# Patient Record
Sex: Female | Born: 1970
Health system: Southern US, Community
[De-identification: ages and names within clinical notes are randomized; demographics above are authoritative.]

## PROBLEM LIST (undated history)

## (undated) DIAGNOSIS — G43909 Migraine, unspecified, not intractable, without status migrainosus: Secondary | ICD-10-CM

## (undated) DIAGNOSIS — Z87442 Personal history of urinary calculi: Secondary | ICD-10-CM

## (undated) DIAGNOSIS — T8859XA Other complications of anesthesia, initial encounter: Secondary | ICD-10-CM

## (undated) DIAGNOSIS — T4145XA Adverse effect of unspecified anesthetic, initial encounter: Secondary | ICD-10-CM

## (undated) DIAGNOSIS — N200 Calculus of kidney: Secondary | ICD-10-CM

## (undated) DIAGNOSIS — K219 Gastro-esophageal reflux disease without esophagitis: Secondary | ICD-10-CM

## (undated) HISTORY — DX: Calculus of kidney: N20.0

## (undated) HISTORY — PX: DILATION AND EVACUATION: SHX1459

## (undated) HISTORY — DX: Migraine, unspecified, not intractable, without status migrainosus: G43.909

## (undated) HISTORY — DX: Gastro-esophageal reflux disease without esophagitis: K21.9

## (undated) HISTORY — PX: DILATION AND CURETTAGE OF UTERUS: SHX78

---
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
## 1997-11-18 ENCOUNTER — Other Ambulatory Visit: Admission: RE | Admit: 1997-11-18 | Discharge: 1997-11-18 | Payer: Self-pay | Admitting: Obstetrics and Gynecology

## 1998-06-17 ENCOUNTER — Inpatient Hospital Stay (HOSPITAL_COMMUNITY): Admission: AD | Admit: 1998-06-17 | Discharge: 1998-06-19 | Payer: Self-pay | Admitting: Obstetrics and Gynecology

## 1998-07-20 ENCOUNTER — Other Ambulatory Visit: Admission: RE | Admit: 1998-07-20 | Discharge: 1998-07-20 | Payer: Self-pay | Admitting: Obstetrics and Gynecology

## 1999-09-02 ENCOUNTER — Other Ambulatory Visit: Admission: RE | Admit: 1999-09-02 | Discharge: 1999-09-02 | Payer: Self-pay | Admitting: Obstetrics and Gynecology

## 2000-02-01 ENCOUNTER — Other Ambulatory Visit: Admission: RE | Admit: 2000-02-01 | Discharge: 2000-02-01 | Payer: Self-pay | Admitting: Obstetrics and Gynecology

## 2000-03-06 ENCOUNTER — Ambulatory Visit (HOSPITAL_COMMUNITY): Admission: RE | Admit: 2000-03-06 | Discharge: 2000-03-06 | Payer: Self-pay | Admitting: Obstetrics and Gynecology

## 2000-03-06 ENCOUNTER — Encounter: Payer: Self-pay | Admitting: Obstetrics and Gynecology

## 2000-07-14 ENCOUNTER — Inpatient Hospital Stay (HOSPITAL_COMMUNITY): Admission: AD | Admit: 2000-07-14 | Discharge: 2000-07-14 | Payer: Self-pay | Admitting: Obstetrics and Gynecology

## 2000-08-22 ENCOUNTER — Inpatient Hospital Stay (HOSPITAL_COMMUNITY): Admission: AD | Admit: 2000-08-22 | Discharge: 2000-08-26 | Payer: Self-pay | Admitting: Obstetrics and Gynecology

## 2000-08-22 ENCOUNTER — Encounter (INDEPENDENT_AMBULATORY_CARE_PROVIDER_SITE_OTHER): Payer: Self-pay

## 2000-10-12 ENCOUNTER — Other Ambulatory Visit: Admission: RE | Admit: 2000-10-12 | Discharge: 2000-10-12 | Payer: Self-pay | Admitting: Obstetrics and Gynecology

## 2003-08-21 ENCOUNTER — Other Ambulatory Visit: Admission: RE | Admit: 2003-08-21 | Discharge: 2003-08-21 | Payer: Self-pay | Admitting: Obstetrics and Gynecology

## 2005-02-02 ENCOUNTER — Other Ambulatory Visit: Admission: RE | Admit: 2005-02-02 | Discharge: 2005-02-02 | Payer: Self-pay | Admitting: Obstetrics and Gynecology

## 2006-09-14 ENCOUNTER — Encounter: Admission: RE | Admit: 2006-09-14 | Discharge: 2006-09-14 | Payer: Self-pay | Admitting: Obstetrics and Gynecology

## 2007-08-29 ENCOUNTER — Encounter: Admission: RE | Admit: 2007-08-29 | Discharge: 2007-08-29 | Payer: Self-pay | Admitting: Obstetrics and Gynecology

## 2008-06-27 HISTORY — PX: CYSTOSCOPY: SUR368

## 2009-03-27 DIAGNOSIS — N200 Calculus of kidney: Secondary | ICD-10-CM

## 2009-03-27 HISTORY — DX: Calculus of kidney: N20.0

## 2010-11-12 NOTE — Discharge Summary (Signed)
[facility]  Patient:    Lisa West, Lisa West                         MRN: [id number] Adm. Date:  [id number] Disc. Date: [id number] Attending:  Madelyn Flavors Dictator:   Danie Chandler, R.N.                           Discharge Summary  ADMISSION DIAGNOSIS:          Intrauterine pregnancy at term in labor.  DISCHARGE DIAGNOSES:          1. Intrauterine pregnancy at term in labor.                               2. Anemia.                               3. Fetal intolerance to labor with shoulder                                  cord.  PROCEDURES:                   On August 23, 2000, primary low cervical transverse cesarean section.  REASON FOR ADMISSION:         The patient is a 40 year old, married, white female, gravida 6, para 2, with an estimated date of confinement of August 28, 2000, placing her at 39+ weeks gestation.  The patient called, complaining of uterine contractions and was sent to the maternity admissions unit and found to be 4 cm dilated.  The patient was admitted in labor.  The patient has been using heparin and baby aspirin during the pregnancy due to recurrent pregnancy loss.  She stopped heparin at 36 weeks and her last baby aspirin was taken on the morning of August 22, 2000.  The patient was admitted and progressed. The patient again pushing with each contraction and she was set up for delivery.  She was noted to have a cervical rim, but was able to push through it.  The patient continued to push uncontrollably during contractions, however, the fetal heart rate was noted to decrease to the 100s, then to the 90s, and then to the 80s with pushes.  After each push, there was excellent variability and recovery of the fetal heart rate.  During the push, the rim of the cervix would retract, but then readvance after the push was completed. The patient continued to push with cervix retracting over the vertex, but again it continued to  advance after completion of pushes.  Subsequently the fetal heart rate tracing was noted to have decreased test variables to the 70s and ______ with good recovery.  It was the decision of the physician that since there was no further descent of the fetal vertex despite the fact that the cervical lip could be retracted and the fact that the variables were broad in scope that the baby could not tolerate continue pushing effort.  The decision was made to proceed with a primary cesarean section due to fetal intolerance of labor.  HOSPITAL COURSE:              The patient was taken to the operating room  and underwent the above-named procedure without complication.  This was productive of a viable female infant with Apgars of 7 at one minute and 9 at five minutes and an arterial cord pH of 7.09.  Postoperatively on day #1, the patients hemoglobin was 8.9, hematocrit 26.3, and white blood cell count 14.4.  The patient was started on iron.  On postoperative day #2, she had a good return of bowel function and was tolerating a regular diet.  She was ambulating well without difficulty and had good pain control.  Her hemoglobin on this day was 8.3.  DISPOSITION:                  She was discharged home on postoperative day #3.  CONDITION ON DISCHARGE:       Good.  DIET:                         Regular as tolerated.  ACTIVITY:                     No heavy lifting, no driving, and no vaginal entry.  FOLLOW-UP:                    She is to follow up in the office in one to two weeks for an incision check.  SPECIAL INSTRUCTIONS:         She is to call for temperature greater than 100 degrees, persistent nausea, vomiting, heavy vaginal bleeding, and/or drainage from the incision site.  DISCHARGE MEDICATIONS:        1. Prenatal vitamins one p.o. q.d.                               2. Percocet 5 mg as directed by M.D.                               3. Chromagen Forte one p.o. q.d. DD:  09/08/00 TD:   09/08/00 Job: [id number]

## 2010-11-12 NOTE — Op Note (Signed)
[facility]  Patient:    Lisa West, Lisa West                         MRN: [id number] Proc. Date: 08/23/00 Adm. Date:  [id number] Attending:  Madelyn Flavors CC:         [facility]  Juan H. Lily Peer, M.D.   Operative Report  PREOPERATIVE DIAGNOSIS:       Fetal intolerance to labor.  POSTOPERATIVE DIAGNOSIS:      Fetal intolerance to labor, shoulder cord.  OPERATION:                    Primary cesarean section, low cervical                               transverse.  SURGEON:                      Beather Arbour. Thomasena Edis, M.D., Ph.D.  ASSISTANT:                    Gaetano Hawthorne. Lily Peer, M.D.  ESTIMATED BLOOD LOSS:         800 cc.  FLUIDS:                       2000 cc Crystalloid plus Ancef IV after cord                               clamp.  DRAINS:                       Foley.  ANESTHESIA:                   Spinal.  COMPLICATIONS:                None.  DESCRIPTION OF PROCEDURE:     I was paged for delivery.  The patient was pushing with each contraction and set up for delivery.  She was noted to have a cervical rim but was able to push through it.  The patient continued to push uncontrollably during contractions; however, the fetal heart rate was noted to decrease to the 100s, then to the 90s, then to the 80s with pushes.  After each push, there was excellent variability and recovery of the fetal heart rate.  During a push, the rim of the cervix would retract but then readvance after the push was completed.  We attempted to have the patient just blow through contractions, but she was unable to do so because of the intense pressure.  She continued to push with cervix retracting over the vertex but again, it continued to advance after completion of pushes.  Subsequently, fetal heart tracing was noted to have decreased test variables to the 70s and 60s with good recovery, but it was this physicians decision that since there was no further  descent of the fetal vertex, despite the fact that I could the cervical lip retracted and the fact that the variables were broad in scope, that this baby could not tolerate continued pushing effort.  The decision was made for a primary cesarean section due to fetal intolerance of labor.  The patient was brought quickly down to the operating room and identified on the operating table.  After induction of adequate spinal anesthesia, she was placed in the left uterine displacement position, and a Foley catheter was inserted.  A Pfannenstiel incision was made and carried down to the fascia. The fascia was extended in the midline and extended bilaterally using Mayo scissors.  It was then separated free from the underlying muscles.  The muscles were separated in the midline down to the symphysis.  The peritoneum was entered bluntly, taking care to avoid bladder or other abdominal contents. Then incision was expanded superiorly then inferiorly down to the bladder edge.  Bladder blade was placed, and the bladder flap was developed.  The uterus was scored in the lower uterine segment, and the incision was extended in a curvilinear fashion using bandage scissors.  As this physician had suspected, the vertex was noted to be in the OP position, and this had been discussed with the patient previously.  The head was deflected, and the vertex was delivered onto the operative field without difficulty.  The infant was duly suctioned, and this was done until there was no further meconium noted. The patients fluid had been noted to be meconium stained at the time of spontaneous rupture of membranes.  The remainder of the infants body was delivered, and there was noted to be a shoulder cord which was easily reduced. The cord was doubly clamped and cut, and the baby was handed to the awaiting neonatal resuscitation team.  The placenta was manually removed and sent to pathology for examination.  The bladder blade  was replaced, and the uterus was exteriorized onto the abdomen and wrapped in wet lap pack.  When clamps were placed on the uterus, there was noted to be about a 3 cm cervical extension on the right, and this was repaired using a simple running, interlocking suture of 0 Monocryl.  The remainder of the uterus was repaired using a simple running, interlocking suture of 0 Monocryl.  There were noted to be three bleeding edges along the uterine incision, and excellent hemostasis of these edges was achieved using figure-of-eight sutures and 0 Monocryl.  The uterus was placed back into the abdominal cavity after noting that uterus, tubes, and ovaries appeared grossly normal.  The incision was again visualized and irrigated copiously with warm Lactated Ringers.  Excellent hemostasis was noted all along the uterine incision.  Kelly clamps were placed in the peritoneum, and the peritoneum was closed using simple running suture of 2-0 Monocryl.  The subfascial areas were examined for evidence of hemostasis. Excellent hemostasis was achieved using cautery.  The fascia was closed using two sutures of 0 PDS with each suture ______ along the incision, running to the midline and tied.  The subcutaneous tissue was irrigated copiously with warm Lactated Ringers, and excellent hemostasis was achieved using cautery. The skin was closed with staples.  The patient tolerated the procedure well without apparent complications and was transferred to the recovery room in stable condition after all instrument, sponge, and needle counts were correct. The baby was found to be a viable female with Apgars of 7 and 9.  Cord pH 7.09. He went to the normal newborn nursery.  It should be noted that the 7.09 cord pH was probably spuriously low due to the fact that we duly suctioned the baby and kept the baby depressed during the time of duly suctioning.  The duly suctioning probably was at least 1 minute in duration to suction  all of the meconium-stained fluid. DD:  08/23/00 TD:  08/23/00  Job: [id number]

## 2011-08-30 ENCOUNTER — Encounter: Payer: Self-pay | Admitting: *Deleted

## 2011-08-31 ENCOUNTER — Encounter: Payer: Self-pay | Admitting: *Deleted

## 2011-08-31 ENCOUNTER — Encounter: Payer: Self-pay | Admitting: Vascular Surgery

## 2011-08-31 ENCOUNTER — Ambulatory Visit (INDEPENDENT_AMBULATORY_CARE_PROVIDER_SITE_OTHER): Payer: Self-pay | Admitting: *Deleted

## 2011-08-31 DIAGNOSIS — I781 Nevus, non-neoplastic: Secondary | ICD-10-CM | POA: Insufficient documentation

## 2011-08-31 NOTE — Progress Notes (Signed)
X=.3% Sotradecol administered with a 27g butterfly.  Patient received a total of 11cc.  Treated her areas of concern. Tol well. Antic good results. Follow prn.  Photos: yes  Compression stockings applied: yes

## 2012-11-14 ENCOUNTER — Other Ambulatory Visit: Payer: Self-pay | Admitting: Obstetrics and Gynecology

## 2012-11-14 DIAGNOSIS — R928 Other abnormal and inconclusive findings on diagnostic imaging of breast: Secondary | ICD-10-CM

## 2012-11-26 ENCOUNTER — Ambulatory Visit
Admission: RE | Admit: 2012-11-26 | Discharge: 2012-11-26 | Disposition: A | Discharge status: Home / Self Care | Payer: 59 | Source: Ambulatory Visit | Attending: Obstetrics and Gynecology | Admitting: Obstetrics and Gynecology

## 2012-11-26 DIAGNOSIS — R928 Other abnormal and inconclusive findings on diagnostic imaging of breast: Secondary | ICD-10-CM

## 2015-07-09 MED FILL — LO LOESTRIN FE 1-10 TABLET: 1 MG-10 MCG | 84 days supply | Qty: 84 | Fill #1

## 2015-10-08 MED FILL — LO LOESTRIN FE 1-10 TABLET: 1 MG-10 MCG | 84 days supply | Qty: 84 | Fill #2

## 2015-12-21 MED FILL — LO LOESTRIN FE 1-10 TABLET: 1 MG-10 MCG | 28 days supply | Qty: 28 | Fill #3

## 2016-01-26 DIAGNOSIS — Z0100 Encounter for examination of eyes and vision without abnormal findings: Secondary | ICD-10-CM | POA: Diagnosis not present

## 2016-01-29 MED FILL — LO LOESTRIN FE 1-10 TABLET: 1 MG-10 MCG | 28 days supply | Qty: 28 | Fill #4

## 2016-03-04 MED FILL — LO LOESTRIN FE 1-10 TABLET: 1 MG-10 MCG | 84 days supply | Qty: 84 | Fill #0

## 2016-04-26 DIAGNOSIS — Z1231 Encounter for screening mammogram for malignant neoplasm of breast: Secondary | ICD-10-CM | POA: Diagnosis not present

## 2016-04-26 DIAGNOSIS — Z01419 Encounter for gynecological examination (general) (routine) without abnormal findings: Secondary | ICD-10-CM | POA: Diagnosis not present

## 2016-04-26 DIAGNOSIS — Z6823 Body mass index (BMI) 23.0-23.9, adult: Secondary | ICD-10-CM | POA: Diagnosis not present

## 2016-05-13 MED FILL — LO LOESTRIN FE 1-10 TABLET: 1 MG-10 MCG | 84 days supply | Qty: 84 | Fill #0

## 2016-08-08 MED FILL — LO LOESTRIN FE 1-10 TABLET: 1 MG-10 MCG | 84 days supply | Qty: 84 | Fill #1

## 2016-11-02 MED FILL — LO LOESTRIN FE 1-10 TABLET: 1 MG-10 MCG | 84 days supply | Qty: 84 | Fill #2

## 2017-01-27 MED FILL — LO LOESTRIN FE 1-10 TABLET: 1 MG-10 MCG | 84 days supply | Qty: 84 | Fill #3

## 2017-01-30 DIAGNOSIS — Z0100 Encounter for examination of eyes and vision without abnormal findings: Secondary | ICD-10-CM | POA: Diagnosis not present

## 2017-04-24 MED FILL — LO LOESTRIN FE 1-10 TABLET: 1 MG-10 MCG | 84 days supply | Qty: 84 | Fill #0

## 2017-07-06 DIAGNOSIS — Z1231 Encounter for screening mammogram for malignant neoplasm of breast: Secondary | ICD-10-CM | POA: Diagnosis not present

## 2017-07-06 DIAGNOSIS — Z01419 Encounter for gynecological examination (general) (routine) without abnormal findings: Secondary | ICD-10-CM | POA: Diagnosis not present

## 2017-07-06 DIAGNOSIS — Z6823 Body mass index (BMI) 23.0-23.9, adult: Secondary | ICD-10-CM | POA: Diagnosis not present

## 2017-07-07 DIAGNOSIS — Z13220 Encounter for screening for lipoid disorders: Secondary | ICD-10-CM | POA: Diagnosis not present

## 2017-07-10 MED FILL — LO LOESTRIN FE 1-10 TABLET: 1 MG-10 MCG | 84 days supply | Qty: 84 | Fill #0

## 2017-10-27 MED FILL — LO LOESTRIN FE 1-10 TABLET: 1 MG-10 MCG | 84 days supply | Qty: 84 | Fill #1

## 2018-01-24 MED FILL — LO LOESTRIN FE 1-10 TABLET: 1 MG-10 MCG | 84 days supply | Qty: 84 | Fill #2

## 2018-02-15 DIAGNOSIS — Z0100 Encounter for examination of eyes and vision without abnormal findings: Secondary | ICD-10-CM | POA: Diagnosis not present

## 2018-04-12 MED FILL — LO LOESTRIN FE 1-10 TABLET: 1 MG-10 MCG | 84 days supply | Qty: 84 | Fill #3

## 2018-07-17 DIAGNOSIS — R3121 Asymptomatic microscopic hematuria: Secondary | ICD-10-CM | POA: Diagnosis not present

## 2018-07-17 DIAGNOSIS — Z6824 Body mass index (BMI) 24.0-24.9, adult: Secondary | ICD-10-CM | POA: Diagnosis not present

## 2018-07-17 DIAGNOSIS — Z01419 Encounter for gynecological examination (general) (routine) without abnormal findings: Secondary | ICD-10-CM | POA: Diagnosis not present

## 2018-07-17 DIAGNOSIS — N390 Urinary tract infection, site not specified: Secondary | ICD-10-CM | POA: Diagnosis not present

## 2018-07-17 DIAGNOSIS — Z1231 Encounter for screening mammogram for malignant neoplasm of breast: Secondary | ICD-10-CM | POA: Diagnosis not present

## 2018-07-17 MED FILL — LO LOESTRIN FE 1-10 TABLET: 1 MG-10 MCG | 84 days supply | Qty: 84 | Fill #0

## 2018-07-27 ENCOUNTER — Encounter: Payer: Self-pay | Admitting: Gastroenterology

## 2018-08-03 ENCOUNTER — Encounter: Payer: Self-pay | Admitting: Gastroenterology

## 2018-08-03 ENCOUNTER — Ambulatory Visit (INDEPENDENT_AMBULATORY_CARE_PROVIDER_SITE_OTHER): Payer: Commercial Managed Care - PPO | Admitting: Gastroenterology

## 2018-08-03 VITALS — BP 120/84 | HR 73 | Ht 62.0 in | Wt 130.1 lb

## 2018-08-03 DIAGNOSIS — R053 Chronic cough: Secondary | ICD-10-CM

## 2018-08-03 DIAGNOSIS — R05 Cough: Secondary | ICD-10-CM | POA: Diagnosis not present

## 2018-08-03 DIAGNOSIS — K219 Gastro-esophageal reflux disease without esophagitis: Secondary | ICD-10-CM

## 2018-08-03 NOTE — Progress Notes (Signed)
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 Started OTC Prilosec 20 mg for 5 weeks with some improvement, but still with daily sxs. Not related to food types; has kept food diary.  Symptoms tend to be postprandial and worse in the evening.  No nocturnal symptoms. No associated dysphagia, odynophagia and no n/v/d/c/f/c, hematochezia, melena.  She did have a URI approximately 2 months prior to symptom onset, but otherwise was in her usual state of health.  No prior history of reflux.  No previous EGD.  No known family history of CRC, GI malignancy, liver disease, pancreatic disease, or IBD.    Past Medical History:  Diagnosis Date  . GERD (gastroesophageal reflux disease)   . Kidney stone 03/2009     Past Surgical History:  Procedure Laterality Date  . CESAREAN SECTION  2002  . CYSTOSCOPY  2010   Family History  Problem Relation Age of Onset  . Kidney disease Maternal Grandmother        kidney stones that cause her to lose one of her kindeys   . Breast cancer Paternal Grandmother 95  . Colon cancer Neg Hx   . Esophageal cancer Neg Hx    Social History   Tobacco Use  . Smoking status: Never Smoker  . Smokeless tobacco: Never Used  Substance Use Topics  . Alcohol use: Yes  . Drug use: Never   Current Outpatient Medications  Medication Sig Dispense Refill  . LO LOESTRIN FE 1 MG-10 MCG / 10 MCG tablet 1 tablet daily.    Marland Kitchen omeprazole (PRILOSEC) 20 MG capsule Take 20 mg by mouth daily.     No current facility-administered medications for this visit.    No Known Allergies   Review of Systems: All systems reviewed and negative except where noted in HPI.     Physical Exam:    Wt Readings from Last 3 Encounters:  08/03/18  130 lb 2 oz (59 kg)    BP 120/84   Pulse 73   Ht 5\' 2"  (1.575 m)   Wt 130 lb 2 oz (59 kg)   BMI 23.80 kg/m  Constitutional:  Pleasant, in no acute distress. Psychiatric: Normal mood and affect. Behavior is normal. EENT: Pupils normal.  Conjunctivae are normal. No scleral icterus. Neck supple. No cervical LAD. Cardiovascular: Normal rate, regular rhythm. No edema Pulmonary/chest: Effort normal and breath sounds normal. No wheezing, rales or rhonchi. Abdominal: Soft, nondistended, nontender. Bowel sounds active throughout. There are no masses palpable. No hepatomegaly. Neurological: Alert and oriented to person place and time. Skin: Skin is warm and dry. No rashes noted.   ASSESSMENT AND PLAN;   Lisa West is a 48 y.o. female presenting with:  1) Heartburn, regurgitation: Clinically she seems most consistent with new onset reflux.  Unclear as to why she did have such an abrupt onset, other than possibly secondary to anatomic changes with recent URI and prolonged (2 months) coughing.  Symptoms started shortly after resolution of that cough.  Has had some relief with OTC Prilosec 20 mg, but still with daily symptoms.  - Discussed trial of high-dose PPI for diagnostic/therapeutic intent versus evaluation with EGD to evaluate for  erosive esophagitis, LES laxity, hiatal hernia with possible intent for preoperative evaluation for a possible endoscopic antireflux procedure in the future.  She opted for the latter which I feel to be reasonable. - Resume antireflux lifestyle measures for now -Okay to resume Prilosec 20 mg for now with plan for expedited EGD to evaluate for anatomic defect, erosive esophagitis, etc. as above -Additional recommendations for ongoing treatment pending endoscopic findings  2) Chronic cough: Suspect atypical reflux manifestation.  Will evaluate for objective evidence of reflux as above.  Pending endoscopic findings, may still need to consider high-dose PPI trial  for diagnostic/therapeutic intent.  She appropriately wants to hold off on high-dose PPI in favor of expedited endoscopic evaluation prior to high-dose treatment, which is reasonable in this setting.  The indications, risks, and benefits of EGD were explained to the patient in detail. Risks include but are not limited to bleeding, perforation, adverse reaction to medications, and cardiopulmonary compromise. Sequelae include but are not limited to the possibility of surgery, hositalization, and mortality. The patient verbalized understanding and wished to proceed. All questions answered, referred to scheduler. Further recommendations pending results of the exam.   Lavena Bullion, DO, FACG  08/03/2018, 9:25 AM   Molli Posey, MD

## 2018-08-03 NOTE — Patient Instructions (Signed)
If you are age 50 or older, your body mass index should be between 23-30. Your Body mass index is 23.8 kg/m. If this is out of the aforementioned range listed, please consider follow up with your Primary Care Provider.  If you are age 15 or younger, your body mass index should be between 19-25. Your Body mass index is 23.8 kg/m. If this is out of the aformentioned range listed, please consider follow up with your Primary Care Provider.   You have been scheduled for an endoscopy. Please follow written instructions given to you at your visit today. If you use inhalers (even only as needed), please bring them with you on the day of your procedure. Your physician has requested that you go to www.startemmi.com and enter the access code given to you at your visit today. This web site gives a general overview about your procedure. However, you should still follow specific instructions given to you by our office regarding your preparation for the procedure.  It was a pleasure to see you today!  Vito Cirigliano, D.O.

## 2018-08-06 ENCOUNTER — Ambulatory Visit (AMBULATORY_SURGERY_CENTER): Payer: Commercial Managed Care - PPO | Admitting: Gastroenterology

## 2018-08-06 ENCOUNTER — Encounter: Payer: Self-pay | Admitting: Gastroenterology

## 2018-08-06 VITALS — BP 133/76 | HR 71 | Temp 99.6°F | Resp 46 | Ht 62.0 in | Wt 130.0 lb

## 2018-08-06 DIAGNOSIS — R12 Heartburn: Secondary | ICD-10-CM | POA: Diagnosis not present

## 2018-08-06 DIAGNOSIS — R05 Cough: Secondary | ICD-10-CM | POA: Diagnosis not present

## 2018-08-06 DIAGNOSIS — R053 Chronic cough: Secondary | ICD-10-CM

## 2018-08-06 DIAGNOSIS — K219 Gastro-esophageal reflux disease without esophagitis: Secondary | ICD-10-CM

## 2018-08-06 MED ORDER — SODIUM CHLORIDE 0.9 % IV SOLN: 500.0000 mL | INTRAVENOUS | Status: DC

## 2018-08-06 MED ORDER — OMEPRAZOLE 40 MG PO CPDR: 40.0000 mg | DELAYED_RELEASE_CAPSULE | Freq: Two times a day (BID) | ORAL | 0 refills | Status: DC

## 2018-08-06 MED FILL — OMEPRAZOLE 40 MG CPDR: 40 | 32 days supply | Qty: 64 | Fill #0

## 2018-08-06 NOTE — Op Note (Signed)
[facility] Patient Name: Lisa West Procedure Date: 08/06/2018 11:30 AM MRN: [id number] Endoscopist: Gerrit Heck , MD Age: 48 Referring MD:  Date of Birth: August 09, 1970 Gender: Female Account #: [id number] Procedure:                Upper GI endoscopy Indications:              Heartburn, Suspected esophageal reflux, Chronic                            cough, Regurgitation                           48 yo female with new onset reflux symptoms                            (heartburn, regurgitation) and non-productive                            cough. No improvement with trial of omeprazole 20                            mg daily. No dysphagia or odynophagia. Presents                            today for endoscopic evaluation for erosive                            esophagitis, LES laxity, hiatal henia. Medicines:                Monitored Anesthesia Care Procedure:                Pre-Anesthesia Assessment:                           - Prior to the procedure, a History and Physical                            was performed, and patient medications and                            allergies were reviewed. The patient's tolerance of                            previous anesthesia was also reviewed. The risks                            and benefits of the procedure and the sedation                            options and risks were discussed with the patient.                            All questions were answered, and informed consent  was obtained. Prior Anticoagulants: The patient has                            taken no previous anticoagulant or antiplatelet                            agents. ASA Grade Assessment: II - A patient with                            mild systemic disease. After reviewing the risks                            and benefits, the patient was deemed in                            satisfactory condition to undergo the procedure.              After obtaining informed consent, the endoscope was                            passed under direct vision. Throughout the                            procedure, the patient's blood pressure, pulse, and                            oxygen saturations were monitored continuously. The                            Endoscope was introduced through the mouth, and                            advanced to the second part of duodenum. The upper                            GI endoscopy was accomplished without difficulty.                            The patient tolerated the procedure well. Scope In: Scope Out: Findings:                 Esophagogastric landmarks were identified: the                            Z-line was found at 36 cm, the gastroesophageal                            junction was found at 36 cm and the site of hiatal                            narrowing was found at 36 cm from the incisors.                           The Z-line was regular and was  found 36 cm from the                            incisors.                           The gastroesophageal flap valve was visualized                            endoscopically and classified as Hill Grade I                            (prominent fold, tight to endoscope).                           The examined esophagus was normal.                           The entire examined stomach was normal.                           The duodenal bulb, first portion of the duodenum                            and second portion of the duodenum were normal. Complications:            No immediate complications. Estimated Blood Loss:     Estimated blood loss: none. Impression:               - Esophagogastric landmarks identified as above.                           - Z-line regular, 36 cm from the incisors.                           - Gastroesophageal flap valve classified as Hill                            Grade I (prominent fold, tight to endoscope).                            - Normal esophagus.                           - Normal stomach.                           - Normal duodenal bulb, first portion of the                            duodenum and second portion of the duodenum.                           - No specimens collected. Recommendation:           - Patient has a contact number available for  emergencies. The signs and symptoms of potential                            delayed complications were discussed with the                            patient. Return to normal activities tomorrow.                            Written discharge instructions were provided to the                            patient.                           - Resume previous diet today.                           - Use Prilosec (omeprazole) 40 mg PO BID for 4                            weeks for diagnostic and therapeutic intent for                            suspected new onset reflux, with plan to titrate to                            lowest effective dose for control of reflux                            symptoms.                           - Depending on response to therapy, may need to                            consider further evaluation with pH/Impedance                            testing if ongoing symptoms despite trial of high                            dose acid suppression therapy.                           - Return to GI clinic in 2-3 months. Gerrit Heck, MD 08/06/2018 11:50:01 AM

## 2018-08-06 NOTE — Progress Notes (Signed)
PT taken to PACU. Monitors in place. VSS. Report given to RN. 

## 2018-08-06 NOTE — Patient Instructions (Signed)
Recommendations for 1st meal: Eggs Grits Toast Pancakes/waffles Lean meat  Take Prilosec 40mg  twice daily for 8 weeks then re-evaluate.  May need pH study. Return to GI clinic in 2-3 months.      YOU HAD AN ENDOSCOPIC PROCEDURE TODAY AT [facility]:   Refer to the procedure report that was given to you for any specific questions about what was found during the examination.  If the procedure report does not answer your questions, please call your gastroenterologist to clarify.  If you requested that your care partner not be given the details of your procedure findings, then the procedure report has been included in a sealed envelope for you to review at your convenience later.  YOU SHOULD EXPECT: Some feelings of bloating in the abdomen. Passage of more gas than usual.  Walking can help get rid of the air that was put into your GI tract during the procedure and reduce the bloating. If you had a lower endoscopy (such as a colonoscopy or flexible sigmoidoscopy) you may notice spotting of blood in your stool or on the toilet paper. If you underwent a bowel prep for your procedure, you may not have a normal bowel movement for a few days.  Please Note:  You might notice some irritation and congestion in your nose or some drainage.  This is from the oxygen used during your procedure.  There is no need for concern and it should clear up in a day or so.  SYMPTOMS TO REPORT IMMEDIATELY:    Following upper endoscopy (EGD)  Vomiting of blood or coffee ground material  New chest pain or pain under the shoulder blades  Painful or persistently difficult swallowing  New shortness of breath  Fever of 100F or higher  Black, tarry-looking stools  For urgent or emergent issues, a gastroenterologist can be reached at any hour by calling [phone].   DIET:  We do recommend a small meal at first, but then you may proceed to your regular diet.  Drink plenty of fluids but you should  avoid alcoholic beverages for 24 hours.  ACTIVITY:  You should plan to take it easy for the rest of today and you should NOT DRIVE or use heavy machinery until tomorrow (because of the sedation medicines used during the test).    FOLLOW UP: Our staff will call the number listed on your records the next business day following your procedure to check on you and address any questions or concerns that you may have regarding the information given to you following your procedure. If we do not reach you, we will leave a message.  However, if you are feeling well and you are not experiencing any problems, there is no need to return our call.  We will assume that you have returned to your regular daily activities without incident.  If any biopsies were taken you will be contacted by phone or by letter within the next 1-3 weeks.  Please call us at [phone] if you have not heard about the biopsies in 3 weeks.    SIGNATURES/CONFIDENTIALITY: You and/or your care partner have signed paperwork which will be entered into your electronic medical record.  These signatures attest to the fact that that the information above on your After Visit Summary has been reviewed and is understood.  Full responsibility of the confidentiality of this discharge information lies with you and/or your care-partner.

## 2018-08-07 ENCOUNTER — Telehealth: Payer: Self-pay

## 2018-08-07 NOTE — Telephone Encounter (Signed)
  Follow up Call-  Call back number 08/06/2018  Post procedure Call Back phone  # [phone]  Permission to leave phone message Yes  Some recent data might be hidden     Patient questions:  Do you have a fever, pain , or abdominal swelling? No. Pain Score  0 *  Have you tolerated food without any problems? Yes.    Have you been able to return to your normal activities? Yes.    Do you have any questions about your discharge instructions: Diet   No. Medications  No. Follow up visit  No.  Do you have questions or concerns about your Care? No.  Actions: * If pain score is 4 or above: No action needed, pain <4.

## 2018-08-07 NOTE — Telephone Encounter (Signed)
Follow call made, name identifier, left a voicemail. 

## 2018-10-25 MED FILL — LO LOESTRIN FE 1-10 TABLET: 1 MG-10 MCG | 84 days supply | Qty: 84 | Fill #0

## 2019-01-17 MED FILL — LO LOESTRIN FE 1-10 TABLET: 1 MG-10 MCG | 84 days supply | Qty: 84 | Fill #0

## 2019-01-24 ENCOUNTER — Ambulatory Visit (INDEPENDENT_AMBULATORY_CARE_PROVIDER_SITE_OTHER): Payer: 59

## 2019-01-24 ENCOUNTER — Ambulatory Visit (INDEPENDENT_AMBULATORY_CARE_PROVIDER_SITE_OTHER): Payer: 59 | Admitting: Surgery

## 2019-01-24 ENCOUNTER — Other Ambulatory Visit: Payer: Self-pay

## 2019-01-24 ENCOUNTER — Encounter: Payer: Self-pay | Admitting: Surgery

## 2019-01-24 VITALS — Ht 62.0 in | Wt 132.0 lb

## 2019-01-24 DIAGNOSIS — M25512 Pain in left shoulder: Secondary | ICD-10-CM

## 2019-01-24 DIAGNOSIS — M7542 Impingement syndrome of left shoulder: Secondary | ICD-10-CM | POA: Diagnosis not present

## 2019-01-24 MED ORDER — BUPIVACAINE HCL 0.25 % IJ SOLN
4.0000 mL | INTRAMUSCULAR | Status: AC | PRN
  Administered 2019-01-24: 09:00:00 4 mL via INTRA_ARTICULAR

## 2019-01-24 MED ORDER — METHYLPREDNISOLONE ACETATE 40 MG/ML IJ SUSP
40.0000 mg | INTRAMUSCULAR | Status: AC | PRN
  Administered 2019-01-24: 09:00:00 40 mg via INTRA_ARTICULAR

## 2019-01-24 MED ORDER — LIDOCAINE HCL 1 % IJ SOLN
3.0000 mL | INTRAMUSCULAR | Status: AC | PRN
  Administered 2019-01-24: 09:00:00 3 mL

## 2019-01-24 NOTE — Progress Notes (Signed)
Office Visit Note   Patient: Lisa West           Date of Birth: 1971-05-17           MRN: [id number] Visit Date: 01/24/2019              Requested by: Molli Posey, [address] [place],  [place] [address] PCP: Molli Posey, MD   Assessment & Plan: Visit Diagnoses:  1. Left shoulder pain, unspecified chronicity   2. Impingement syndrome of left shoulder     Plan: With patient's ongoing left shoulder pain up to this point I recommend trying conservative treatment with subacromial Marcaine/Depo-Medrol injection.  After patient consent left shoulder was prepped with Betadine and subacromial injection performed.  After sitting for about 5 minutes patient reported complete relief of her pain with Marcaine in place.  Follow-up with me in 4 weeks for recheck.  With patient working in the [employer] I will touch base with her weekly to see how she is feeling.  I did advise her to use over-the-counter NSAID as needed.  If she still continues to be symptomatic at her next appointment we may consider getting an MRI scan.  I did advise her to take it easy over the next couple of days and then resume activities as tolerated if she is pain-free.  All questions answered.  Follow-Up Instructions: Return in about 4 weeks (around 02/21/2019) for With Jeneen Rinks.   Orders:  Orders Placed This Encounter  Procedures  . XR Shoulder Left   No orders of the defined types were placed in this encounter.     Procedures: Large Joint Inj: L subacromial bursa on 01/24/2019 9:16 AM Indications: pain Details: 25 G 1.5 in needle, posterior approach  Arthrogram: No  Medications: 3 mL lidocaine 1 %; 4 mL bupivacaine 0.25 %; 40 mg methylPREDNISolone acetate 40 MG/ML Outcome: tolerated well, no immediate complications Consent was given by the patient. Patient was prepped and draped in the usual sterile fashion.       Clinical Data: No additional findings.   Subjective: Chief Complaint   Patient presents with  . 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  Pain ongoing x5 weeks.  No specific injury that she can recall.  Thinks that this may have been aggravated by laying on her shoulder wrong at night.  No previous problems for onset.  Currently pain bothered with overhead activity and reaching on her back.  No cervical spine or radicular component.  No feeling of shoulder instability.  Pain does awaken her at night still.  Also bothers her while she is working in the [employer]. Review of Systems No current cardiac pulmonary GI GU issues  Objective: Vital Signs: Ht 5\' 2"  (1.575 m)   Wt 132 lb (59.9 kg)   BMI 24.14 kg/m   Physical Exam Constitutional:      Appearance: Normal appearance.  HENT:     Head: Normocephalic.  Eyes:     Extraocular Movements: Extraocular movements intact.     Pupils: Pupils are equal, round, and reactive to light.  Pulmonary:     Effort: Pulmonary effort is normal. No respiratory distress.     Breath sounds: Normal breath sounds.  Musculoskeletal:     Comments: Gait is normal.  Good cervical spine range of motion.  No brachial plexus tenderness.  Left  shoulder she has good range of motion but with discomfort overhead.  Positive impingement test.  Negative drop arm.  Negative apprehension.  Good cuff strength.  Pain with supraspinatus resistance testing.  Neurovascular intact.  Right shoulder unremarkable.  Skin:    General: Skin is warm and dry.  Neurological:     General: No focal deficit present.     Mental Status: She is alert and oriented to person, place, and time.  Psychiatric:        Mood and Affect: Mood normal.        Behavior: Behavior normal.     Ortho Exam  Specialty Comments:  No specialty comments available.  Imaging: Xr Shoulder Left  Result Date: 01/24/2019 X-ray left shoulder shows mild to moderate acromioclavicular  degenerative changes.  Glenohumeral joint looks good.  No acute finding.  Type I acromion.    PMFS History: Patient Active Problem List   Diagnosis Date Noted  . Nevus, non-neoplastic 08/31/2011   Past Medical History:  Diagnosis Date  . GERD (gastroesophageal reflux disease)   . Kidney stone 03/2009    Family History  Problem Relation Age of Onset  . Kidney disease Maternal Grandmother        kidney stones that cause her to lose one of her kindeys   . Breast cancer Paternal Grandmother 95  . Colon cancer Neg Hx   . Esophageal cancer Neg Hx   . Rectal cancer Neg Hx   . Stomach cancer Neg Hx     Past Surgical History:  Procedure Laterality Date  . CESAREAN SECTION  2002  . CYSTOSCOPY  2010  . DILATION AND CURETTAGE OF UTERUS    . DILATION AND EVACUATION     X 2   Social History   Occupational History  . Occupation: Immunologist  Tobacco Use  . Smoking status: Never Smoker  . Smokeless tobacco: Never Used  Substance and Sexual Activity  . Alcohol use: Yes    Alcohol/week: 4.0 standard drinks    Types: 4 Glasses of wine per week  . Drug use: Never  . Sexual activity: Not on file

## 2019-01-25 DIAGNOSIS — S40211A Abrasion of right shoulder, initial encounter: Secondary | ICD-10-CM | POA: Diagnosis not present

## 2019-01-25 DIAGNOSIS — S61412A Laceration without foreign body of left hand, initial encounter: Secondary | ICD-10-CM | POA: Diagnosis not present

## 2019-01-25 DIAGNOSIS — W19XXXA Unspecified fall, initial encounter: Secondary | ICD-10-CM | POA: Diagnosis not present

## 2019-02-21 ENCOUNTER — Ambulatory Visit: Payer: 59 | Admitting: Surgery

## 2019-03-14 DIAGNOSIS — Z0100 Encounter for examination of eyes and vision without abnormal findings: Secondary | ICD-10-CM | POA: Diagnosis not present

## 2019-04-29 DIAGNOSIS — D649 Anemia, unspecified: Secondary | ICD-10-CM | POA: Diagnosis not present

## 2019-04-29 DIAGNOSIS — N920 Excessive and frequent menstruation with regular cycle: Secondary | ICD-10-CM | POA: Diagnosis not present

## 2019-04-29 DIAGNOSIS — D259 Leiomyoma of uterus, unspecified: Secondary | ICD-10-CM | POA: Diagnosis not present

## 2019-05-02 DIAGNOSIS — N939 Abnormal uterine and vaginal bleeding, unspecified: Secondary | ICD-10-CM | POA: Diagnosis not present

## 2019-05-02 DIAGNOSIS — N920 Excessive and frequent menstruation with regular cycle: Secondary | ICD-10-CM | POA: Diagnosis not present

## 2019-05-02 DIAGNOSIS — D259 Leiomyoma of uterus, unspecified: Secondary | ICD-10-CM | POA: Diagnosis not present

## 2019-05-02 NOTE — Patient Instructions (Addendum)
YOU NEED TO HAVE A COVID 19 TEST ON_11/6______ @_12 :50 pm______, THIS TEST MUST BE DONE BEFORE SURGERY, COME  [address], [place] , [address].  ([place]) ONCE YOUR COVID TEST IS COMPLETED, PLEASE BEGIN THE QUARANTINE INSTRUCTIONS AS OUTLINED IN YOUR HANDOUT.       Lisa West       Your procedure is scheduled on 05/07/19   Report to [facility]  at   5:30 A.M.   Call this number if you have problems the morning of surgery:[phone]    OUR ADDRESS IS [address], WE ARE LOCATED IN THE [facility]ICAL [facility] WITH [facility].  Do not eat food After Midnight.   YOU MAY HAVE CLEAR LIQUIDS FROM MIDNIGHT UNTIL 4:30AM.   At 4:30AM Please finish the prescribed Pre-Surgery  drink.   Nothing by mouth after you finish the  drink !    Take these medicines the morning of surgery with A SIP OF WATER  prilosec   Do not wear jewelry, make-up or nail polish.  Do not wear lotions, powders, or perfumes, or deoderant.  Do not shave 48 hours prior to surgery.    Do not bring valuables to the hospital.  [facility] is not responsible for any belongings or valuables.  Contacts, dentures or bridgework may not be worn into surgery.     For patients admitted to the hospital, discharge time will be determined by your treatment team.  Patients discharged the day of surgery will not be allowed to drive home.   Special instructions:           [facility] - Preparing for Surgery  Before surgery, you can play an important role.   Because skin is not sterile, your skin needs to be as free of germs as possible.   You can reduce the number of germs on your skin by washing with CHG (chlorahexidine gluconate) soap before surgery.   CHG is an antiseptic cleaner which kills germs and bonds with the skin to continue killing germs even after washing. Please DO NOT use if you have an allergy to CHG or antibacterial soaps.   If your skin becomes  reddened/irritated stop using the CHG and inform your nurse when you arrive at Short Stay. Do not shave (including legs and underarms) for at least 48 hours prior to the first CHG shower.    Please follow these instructions carefully:  1.  Shower with CHG Soap the night before surgery and the  morning of Surgery.  2.  If you choose to wash your hair, wash your hair first as usual with your  normal  shampoo.  3.  After you shampoo, rinse your hair and body thoroughly to remove the  shampoo.                                        4.  Use CHG as you would any other liquid soap.  You can apply chg directly  to the skin and wash                       Gently with a scrungie or clean washcloth.  5.  Apply the CHG Soap to your body ONLY FROM THE NECK DOWN.   Do not use on face/ open  Wound or open sores. Avoid contact with eyes, ears mouth and genitals (private parts).                       Wash face,  Genitals (private parts) with your normal soap.             6.  Wash thoroughly, paying special attention to the area where your surgery  will be performed.  7.  Thoroughly rinse your body with warm water from the neck down.  8.  DO NOT shower/wash with your normal soap after using and rinsing off  the CHG Soap.             9.  Pat yourself dry with a clean towel.            10.  Wear clean pajamas.            11.  Place clean sheets on your bed the night of your first shower and do not  sleep with pets. Day of Surgery : Do not apply any lotions/deodorants the morning of surgery.  Please wear clean clothes to the hospital/surgery center.  FAILURE TO FOLLOW THESE INSTRUCTIONS MAY RESULT IN THE CANCELLATION OF YOUR SURGERY PATIENT SIGNATURE_________________________________  NURSE SIGNATURE__________________________________  ________________________________________________________________________

## 2019-05-03 ENCOUNTER — Encounter (HOSPITAL_COMMUNITY)
Admission: RE | Admit: 2019-05-03 | Discharge: 2019-05-03 | Disposition: A | Discharge status: Home / Self Care | Payer: 59 | Source: Ambulatory Visit | Attending: Obstetrics and Gynecology | Admitting: Obstetrics and Gynecology

## 2019-05-03 ENCOUNTER — Other Ambulatory Visit (HOSPITAL_COMMUNITY)
Admission: RE | Admit: 2019-05-03 | Discharge: 2019-05-03 | Disposition: A | Discharge status: Home / Self Care | Payer: 59 | Source: Ambulatory Visit | Attending: Obstetrics and Gynecology | Admitting: Obstetrics and Gynecology

## 2019-05-03 ENCOUNTER — Encounter (HOSPITAL_COMMUNITY): Payer: Self-pay

## 2019-05-03 ENCOUNTER — Other Ambulatory Visit: Payer: Self-pay

## 2019-05-03 DIAGNOSIS — Z20828 Contact with and (suspected) exposure to other viral communicable diseases: Secondary | ICD-10-CM | POA: Diagnosis not present

## 2019-05-03 DIAGNOSIS — Z01812 Encounter for preprocedural laboratory examination: Secondary | ICD-10-CM | POA: Insufficient documentation

## 2019-05-03 DIAGNOSIS — D259 Leiomyoma of uterus, unspecified: Secondary | ICD-10-CM | POA: Diagnosis not present

## 2019-05-03 HISTORY — DX: Other complications of anesthesia, initial encounter: T88.59XA

## 2019-05-03 HISTORY — DX: Personal history of urinary calculi: Z87.442

## 2019-05-03 LAB — CBC
HCT: 38.6 % (ref 36.0–46.0)
Hemoglobin: 12.5 g/dL (ref 12.0–15.0)
MCH: 31 pg (ref 26.0–34.0)
MCHC: 32.4 g/dL (ref 30.0–36.0)
MCV: 95.8 fL (ref 80.0–100.0)
Platelets: 271 10*3/uL (ref 150–400)
RBC: 4.03 MIL/uL (ref 3.87–5.11)
RDW: 12.5 % (ref 11.5–15.5)
WBC: 9.8 10*3/uL (ref 4.0–10.5)
nRBC: 0 % (ref 0.0–0.2)

## 2019-05-03 LAB — ABO/RH: ABO/RH(D): O POS

## 2019-05-03 NOTE — Progress Notes (Signed)
PCP - Dr. Matthew Saras Cardiologist - none  Chest x-ray - no EKG - no Stress Test - no ECHO - no Cardiac Cath - no  Sleep Study - no CPAP - no  Fasting Blood Sugar - NA Checks Blood Sugar _____ times a day  Blood Thinner Instructions:  NA Aspirin Instructions: Last Dose:  Anesthesia review:   Patient denies shortness of breath, fever, cough and chest pain at PAT appointment yes  Patient verbalized understanding of instructions that were given to them at the PAT appointment. Patient was also instructed that they will need to review over the PAT instructions again at home before surgery. yes

## 2019-05-04 LAB — NOVEL CORONAVIRUS, NAA (HOSP ORDER, SEND-OUT TO REF LAB; TAT 18-24 HRS): SARS-CoV-2, NAA: NOT DETECTED

## 2019-05-05 ENCOUNTER — Emergency Department (HOSPITAL_COMMUNITY)
Admission: EM | Admit: 2019-05-05 | Discharge: 2019-05-05 | Disposition: A | Discharge status: Home / Self Care | Payer: 59 | Attending: Emergency Medicine | Admitting: Emergency Medicine

## 2019-05-05 ENCOUNTER — Encounter (HOSPITAL_COMMUNITY): Payer: Self-pay

## 2019-05-05 ENCOUNTER — Other Ambulatory Visit: Payer: Self-pay

## 2019-05-05 DIAGNOSIS — Z79899 Other long term (current) drug therapy: Secondary | ICD-10-CM | POA: Insufficient documentation

## 2019-05-05 DIAGNOSIS — N938 Other specified abnormal uterine and vaginal bleeding: Secondary | ICD-10-CM | POA: Insufficient documentation

## 2019-05-05 DIAGNOSIS — D649 Anemia, unspecified: Secondary | ICD-10-CM | POA: Diagnosis not present

## 2019-05-05 DIAGNOSIS — Z793 Long term (current) use of hormonal contraceptives: Secondary | ICD-10-CM | POA: Diagnosis not present

## 2019-05-05 DIAGNOSIS — R42 Dizziness and giddiness: Secondary | ICD-10-CM | POA: Diagnosis present

## 2019-05-05 LAB — CBC WITH DIFFERENTIAL/PLATELET
Abs Immature Granulocytes: 0.02 10*3/uL (ref 0.00–0.07)
Basophils Absolute: 0 10*3/uL (ref 0.0–0.1)
Basophils Relative: 1 %
Eosinophils Absolute: 0.1 10*3/uL (ref 0.0–0.5)
Eosinophils Relative: 1 %
HCT: 30 % — ABNORMAL LOW (ref 36.0–46.0)
Hemoglobin: 9.8 g/dL — ABNORMAL LOW (ref 12.0–15.0)
Immature Granulocytes: 0 %
Lymphocytes Relative: 20 %
Lymphs Abs: 1.5 10*3/uL (ref 0.7–4.0)
MCH: 30.8 pg (ref 26.0–34.0)
MCHC: 32.7 g/dL (ref 30.0–36.0)
MCV: 94.3 fL (ref 80.0–100.0)
Monocytes Absolute: 0.5 10*3/uL (ref 0.1–1.0)
Monocytes Relative: 6 %
Neutro Abs: 5.6 10*3/uL (ref 1.7–7.7)
Neutrophils Relative %: 72 %
Platelets: 257 10*3/uL (ref 150–400)
RBC: 3.18 MIL/uL — ABNORMAL LOW (ref 3.87–5.11)
RDW: 12.3 % (ref 11.5–15.5)
WBC: 7.6 10*3/uL (ref 4.0–10.5)
nRBC: 0 % (ref 0.0–0.2)

## 2019-05-05 LAB — BASIC METABOLIC PANEL
Anion gap: 8 (ref 5–15)
BUN: 10 mg/dL (ref 6–20)
CO2: 23 mmol/L (ref 22–32)
Calcium: 8.4 mg/dL — ABNORMAL LOW (ref 8.9–10.3)
Chloride: 108 mmol/L (ref 98–111)
Creatinine, Ser: 0.61 mg/dL (ref 0.44–1.00)
GFR calc Af Amer: >60 mL/min (ref >60)
GFR calc non Af Amer: >60 mL/min (ref >60)
Glucose, Bld: 95 mg/dL (ref 70–99)
Potassium: 3.7 mmol/L (ref 3.5–5.1)
Sodium: 139 mmol/L (ref 135–145)

## 2019-05-05 LAB — ABO/RH: ABO/RH(D): O POS

## 2019-05-05 LAB — PREPARE RBC (CROSSMATCH)

## 2019-05-05 MED ORDER — IBUPROFEN 400 MG PO TABS
600.0000 mg | ORAL_TABLET | Freq: Once | ORAL | Status: AC
  Administered 2019-05-05: 600 mg via ORAL
  Filled 2019-05-05: qty 1

## 2019-05-05 MED ORDER — MEGESTROL ACETATE 40 MG PO TABS
40.0000 mg | ORAL_TABLET | Freq: Every day | ORAL | Status: DC
  Administered 2019-05-05: 15:00:00 40 mg via ORAL
  Filled 2019-05-05: qty 1

## 2019-05-05 MED ORDER — MEGESTROL ACETATE 40 MG PO TABS: 40.0000 mg | ORAL_TABLET | Freq: Every day | ORAL | 0 refills | Status: DC

## 2019-05-05 MED ORDER — SODIUM CHLORIDE 0.9 % IV SOLN
10.0000 mL/h | Freq: Once | INTRAVENOUS | Status: AC
  Administered 2019-05-05: 10 mL/h via INTRAVENOUS

## 2019-05-05 NOTE — Discharge Instructions (Addendum)
Take the Megace tomorrow.  Keep yourself hydrated.  You can call Dr. Matthew Saras tomorrow.

## 2019-05-05 NOTE — ED Provider Notes (Signed)
[facility] EMERGENCY DEPARTMENT Provider Note   CSN: [id number] Arrival date & time: 05/05/19  1051     History   Chief Complaint No chief complaint on file.   HPI Lisa West is a 48 y.o. female.     HPI Patient presents with lightheadedness and dizziness.  Has had heavy vaginal bleeding since Friday.  Has had heavy vaginal bleeding since September but not as heavy.  Saw her gynecologist on Monday.  Hemoglobin of 12 at that time.  She is scheduled for hysterectomy 2 days from now, which is Tuesday.  However she has had increased bleeding now since Friday.  States she is gone through 32 heavy flow tampons and 20 pads.  States she now feels lightheaded and dizzy.  States she feels worse when she stands up.  States however that the bleeding has decreased somewhat today.  It is now close to noon and states she is only used 3 tampons today.  States she feels more short of breath and is having trouble walking around.  Dr. Matthew Saras is due to to do the surgery at the [facility] surgery center on Tuesday and patient was going to spend the night in the hospital.  Patient reportedly had ultrasound done that showed a fibroid in the top of the uterus. Past Medical History:  Diagnosis Date  . Complication of anesthesia    hard to wake up  . History of kidney stones   . Kidney stone 03/2009    Patient Active Problem List   Diagnosis Date Noted  . Nevus, non-neoplastic 08/31/2011    Past Surgical History:  Procedure Laterality Date  . CESAREAN SECTION  2002  . CYSTOSCOPY  2010  . DILATION AND CURETTAGE OF UTERUS    . DILATION AND EVACUATION     X 2  . TONSILLECTOMY  1982     OB History   No obstetric history on file.      Home Medications    Prior to Admission medications   Medication Sig Start Date End Date Taking? Authorizing Provider  LO LOESTRIN FE 1 MG-10 MCG / 10 MCG tablet 1 tablet daily. 07/17/18   [provider]  omeprazole (PRILOSEC) 40  MG capsule Take 1 capsule (40 mg total) by mouth 2 (two) times daily. 08/06/18   Cirigliano, Dominic Pea, DO    Family History Family History  Problem Relation Age of Onset  . Kidney disease Maternal Grandmother        kidney stones that cause her to lose one of her kindeys   . Breast cancer Paternal Grandmother 95  . Colon cancer Neg Hx   . Esophageal cancer Neg Hx   . Rectal cancer Neg Hx   . Stomach cancer Neg Hx     Social History Social History   Tobacco Use  . Smoking status: Never Smoker  . Smokeless tobacco: Never Used  Substance Use Topics  . Alcohol use: Yes    Alcohol/week: 4.0 standard drinks    Types: 4 Glasses of wine per week  . Drug use: Never     Allergies   Patient has no known allergies.   Review of Systems Review of Systems  Constitutional: Negative for appetite change.  Respiratory: Positive for shortness of breath.   Cardiovascular: Negative for chest pain.  Gastrointestinal: Negative for abdominal pain.  Genitourinary: Positive for vaginal bleeding.  Musculoskeletal: Negative for back pain.  Skin: Negative for rash.  Neurological: Positive for light-headedness.  Psychiatric/Behavioral:  Negative for confusion.     Physical Exam Updated Vital Signs BP (!) 123/92 (BP Location: Left Arm)   Pulse 90   Temp 98.2 F (36.8 C) (Oral)   Resp 12   Ht 5\' 2"  (1.575 m)   Wt 59 kg   LMP 05/03/2019   SpO2 100%   BMI 23.78 kg/m   Physical Exam HENT:     Head: Atraumatic.  Eyes:     Pupils: Pupils are equal, round, and reactive to light.  Cardiovascular:     Rate and Rhythm: Normal rate and regular rhythm.  Pulmonary:     Effort: Pulmonary effort is normal.  Abdominal:     Tenderness: There is no abdominal tenderness.  Skin:    General: Skin is warm.     Capillary Refill: Capillary refill takes less than 2 seconds.     Coloration: Skin is not pale.  Neurological:     Mental Status: She is alert. Mental status is at baseline.   Pelvic exam  showed mild cervical bleeding.   ED Treatments / Results  Labs (all labs ordered are listed, but only abnormal results are displayed) Labs Reviewed  CBC WITH DIFFERENTIAL/PLATELET - Abnormal; Notable for the following components:      Result Value   RBC 3.18 (*)    Hemoglobin 9.8 (*)    HCT 30.0 (*)    All other components within normal limits  BASIC METABOLIC PANEL  TYPE AND SCREEN    EKG None  Radiology No results found.  Procedures Procedures (including critical care time)  Medications Ordered in ED Medications - No data to display   Initial Impression / Assessment and Plan / ED Course  I have reviewed the triage vital signs and the nursing notes.  Pertinent labs & imaging results that were available during my care of the patient were reviewed by me and considered in my medical decision making (see chart for details).        Patient with anemia due to blood loss from fibroids.  Hemoglobin down almost 3 g in 2 days.  Discussed with Dr. Helane Rima. Will transfuse here.  Megace for home.  Discharge for hysterectomy in 2 days.  Final Clinical Impressions(s) / ED Diagnoses   Final diagnoses:  None    ED Discharge Orders    None       Davonna Belling, MD 05/05/19 2055

## 2019-05-05 NOTE — ED Notes (Signed)
Monitor applied to pt.

## 2019-05-05 NOTE — ED Notes (Signed)
Assisted during pelvic exam.

## 2019-05-05 NOTE — ED Notes (Signed)
Blood transfusion - 2nd unit completed. Pt states she feels much better. Pt's color noted to have improved. Spouse at bedside. Message sent to Dr Alvino Chapel notifying of blood transfusion completed.

## 2019-05-05 NOTE — ED Notes (Signed)
Lunch Tray Ordered @1349 .

## 2019-05-05 NOTE — ED Triage Notes (Signed)
Patient with heavy vaginal bleeding with large clots since Friday, scheduled for hysterectomy on Tuesday. Experiencing SOB/weakness with any exertion

## 2019-05-05 NOTE — ED Notes (Signed)
Pt arrived to Rm 52 via stretcher. Pt alert, oriented, cooperative, pleasant. Pt ambulated to bed w/o difficulty. Pt eating snack while waiting for lunch. Pt voiced tx plan - will receive blood transfusion x 2 then d/c to home.

## 2019-05-05 NOTE — ED Notes (Signed)
Pt discharged from ED; instructions provided and scripts given; Pt encouraged to return to ED if symptoms worsen and to f/u with PCP; Pt verbalized understanding of all instructions 

## 2019-05-06 LAB — TYPE AND SCREEN
ABO/RH(D): O POS
Antibody Screen: NEGATIVE
Unit division: 0
Unit division: 0

## 2019-05-06 LAB — BPAM RBC
Blood Product Expiration Date: 202012112359
Blood Product Expiration Date: 202012112359
ISSUE DATE / TIME: 202011081323
ISSUE DATE / TIME: 202011081607
Unit Type and Rh: 5100
Unit Type and Rh: 5100

## 2019-05-06 MED FILL — MEGESTROL 40 MG TABLET: 40 | 1 days supply | Qty: 1 | Fill #0

## 2019-05-06 NOTE — H&P (Signed)
NAME: Lisa West, Lisa West MEDICAL RECORD [id number] ACCOUNT [id number] DATE OF BIRTH:June 04, 1971 FACILITY: [facility] LOCATION: [facility] PHYSICIAN:Johnedward Brodrick Garry Heater, MD  HISTORY AND PHYSICAL  DATE OF ADMISSION:  05/07/2019  Scheduled for surgery at [facility]:  Menorrhagia, leiomyoma.  HISTORY OF PRESENT ILLNESS:  A 48 year old G6 P3 LC3 has an 14, 51 and 49 year old.  This patient has known history of fibroids, until recently has done well with good control on her oral contraceptive pill.  She has recently developed worsening  menorrhagia.  Sonohysterogram in our office on 11/5 showed that she had multiple fibroids that have increased in size and 1 in particular was impinging on the cavity from the superior fundal side.  In total, 4 to 5 fibroids, the largest being 4.5 cm.  We had  previously tried a Mirena IUD, which she expelled and at this time, she prefers to have definitive treatment.  The procedure of TAH with bilateral salpingectomy has been reviewed.  Specific risks regarding bleeding, infection, transfusion, wound  infection, phlebitis, the rationale for salpingectomy to reduce later like risk of ovarian cancer.  All reviewed along with her expected recovery time.  PAST MEDICAL HISTORY:   ALLERGIES:  None.  MEDICATIONS:  She is on oral contraceptives and omeprazole as needed.  Her last Pap smear was in 2017.  She is up to date on her mammogram.  SURGICAL HISTORY:  She has had 2 vaginal deliveries, 1 C-section.  FAMILY HISTORY:  Significant for grandmother with breast cancer.  SOCIAL HISTORY:  She is a nonsmoker, drinks alcohol occasionally.  She is working as a Music therapist.  PAST SURGICAL HISTORY:  She has had cesarean section.  3 D and Cs for miscarriage.  She has had kidney stone surgery and tonsillectomy.  PHYSICAL EXAMINATION: VITAL SIGNS:  Temperature 98.2, blood pressure 120/68. HEENT:  Unremarkable. NECK:  Supple, without masses.  Thyroid  not palpable. HEART AND LUNGS:  Clear. BREASTS:  Without masses, tenderness, or discharge. ABDOMEN:  Soft, flat, nontender. PELVIC:  Vulva, vagina, cervix normal.  Uterus itself is 11-12 week size.  Adnexa negative. EXTREMITIES:  Unremarkable. NEUROLOGIC:  Unremarkable.  IMPRESSION:  Symptomatic leiomyoma with menorrhagia.  PLAN:  TAH, bilateral salpingectomy.  Procedure and risks reviewed as above.  CN/NUANCE  D:05/03/2019 T:05/03/2019 JOB:[id number]

## 2019-05-06 NOTE — Anesthesia Preprocedure Evaluation (Addendum)
Anesthesia Evaluation  Patient identified by MRN, date of birth, ID band Patient awake    Reviewed: Allergy & Precautions, NPO status , Patient's Chart, lab work & pertinent test results  Airway Mallampati: I  TM Distance: >3 FB Neck ROM: Full    Dental  (+) Teeth Intact, Dental Advisory Given, Chipped,    Pulmonary neg pulmonary ROS,    breath sounds clear to auscultation       Cardiovascular Exercise Tolerance: Good negative cardio ROS   Rhythm:Regular Rate:Normal     Neuro/Psych negative neurological ROS  negative psych ROS   GI/Hepatic negative GI ROS, Neg liver ROS,   Endo/Other    Renal/GU Renal diseaseHgb 3.7 Cr 0.61     Musculoskeletal negative musculoskeletal ROS (+)   Abdominal Normal abdominal exam  (+)   Peds  Hematology  (+) anemia , Hgb 9.8 Plt 257 T&S   Anesthesia Other Findings   Reproductive/Obstetrics                          Anesthesia Physical Anesthesia Plan  ASA: II  Anesthesia Plan: General   Post-op Pain Management:    Induction: Intravenous  PONV Risk Score and Plan: 4 or greater and Treatment may vary due to age or medical condition, Ondansetron, Dexamethasone, Midazolam and Scopolamine patch - Pre-op  Airway Management Planned: Oral ETT  Additional Equipment: None  Intra-op Plan:   Post-operative Plan: Extubation in OR  Informed Consent: I have reviewed the patients History and Physical, chart, labs and discussed the procedure including the risks, benefits and alternatives for the proposed anesthesia with the patient or authorized representative who has indicated his/her understanding and acceptance.     Dental advisory given  Plan Discussed with: CRNA  Anesthesia Plan Comments: (GA w ETT.  Lidocaine infusion. )      Anesthesia Quick Evaluation

## 2019-05-07 ENCOUNTER — Ambulatory Visit (HOSPITAL_BASED_OUTPATIENT_CLINIC_OR_DEPARTMENT_OTHER): Payer: 59 | Admitting: Physician Assistant

## 2019-05-07 ENCOUNTER — Encounter (HOSPITAL_BASED_OUTPATIENT_CLINIC_OR_DEPARTMENT_OTHER): Payer: Self-pay | Admitting: *Deleted

## 2019-05-07 ENCOUNTER — Ambulatory Visit (HOSPITAL_BASED_OUTPATIENT_CLINIC_OR_DEPARTMENT_OTHER): Payer: 59 | Admitting: Anesthesiology

## 2019-05-07 ENCOUNTER — Other Ambulatory Visit: Payer: Self-pay

## 2019-05-07 ENCOUNTER — Encounter (HOSPITAL_BASED_OUTPATIENT_CLINIC_OR_DEPARTMENT_OTHER)
Admission: RE | Disposition: A | Discharge status: Home / Self Care | Payer: Self-pay | Source: Home / Self Care | Attending: Obstetrics and Gynecology

## 2019-05-07 ENCOUNTER — Observation Stay (HOSPITAL_BASED_OUTPATIENT_CLINIC_OR_DEPARTMENT_OTHER)
Admission: RE | Admit: 2019-05-07 | Discharge: 2019-05-08 | Disposition: A | Discharge status: Home / Self Care | Payer: 59 | Attending: Obstetrics and Gynecology | Admitting: Obstetrics and Gynecology

## 2019-05-07 DIAGNOSIS — D219 Benign neoplasm of connective and other soft tissue, unspecified: Secondary | ICD-10-CM | POA: Diagnosis present

## 2019-05-07 DIAGNOSIS — D259 Leiomyoma of uterus, unspecified: Secondary | ICD-10-CM | POA: Diagnosis not present

## 2019-05-07 DIAGNOSIS — D252 Subserosal leiomyoma of uterus: Secondary | ICD-10-CM | POA: Diagnosis not present

## 2019-05-07 DIAGNOSIS — Z79899 Other long term (current) drug therapy: Secondary | ICD-10-CM | POA: Insufficient documentation

## 2019-05-07 DIAGNOSIS — Z793 Long term (current) use of hormonal contraceptives: Secondary | ICD-10-CM | POA: Insufficient documentation

## 2019-05-07 DIAGNOSIS — N800 Endometriosis of uterus: Secondary | ICD-10-CM | POA: Insufficient documentation

## 2019-05-07 DIAGNOSIS — N920 Excessive and frequent menstruation with regular cycle: Secondary | ICD-10-CM | POA: Insufficient documentation

## 2019-05-07 DIAGNOSIS — G8918 Other acute postprocedural pain: Secondary | ICD-10-CM | POA: Diagnosis not present

## 2019-05-07 HISTORY — PX: ABDOMINAL HYSTERECTOMY: SHX81

## 2019-05-07 LAB — POCT PREGNANCY, URINE: Preg Test, Ur: NEGATIVE

## 2019-05-07 LAB — TYPE AND SCREEN
ABO/RH(D): O POS
ABO/RH(D): O POS
Antibody Screen: NEGATIVE
Antibody Screen: NEGATIVE

## 2019-05-07 SURGERY — HYSTERECTOMY, ABDOMINAL
Anesthesia: General | Site: Abdomen | Laterality: Bilateral

## 2019-05-07 MED ORDER — DEXTROSE IN LACTATED RINGERS 5 % IV SOLN
INTRAVENOUS | Status: DC
  Administered 2019-05-07: 23:00:00 via INTRAVENOUS
  Filled 2019-05-07: qty 1000

## 2019-05-07 MED ORDER — HYDROMORPHONE HCL 1 MG/ML IJ SOLN
0.2500 mg | INTRAMUSCULAR | Status: DC | PRN
  Administered 2019-05-07 (×4): 0.25 mg via INTRAVENOUS
  Filled 2019-05-07: qty 0.5

## 2019-05-07 MED ORDER — ACETAMINOPHEN 500 MG PO TABS
1000.0000 mg | ORAL_TABLET | Freq: Once | ORAL | Status: AC
  Administered 2019-05-07: 06:00:00 1000 mg via ORAL
  Filled 2019-05-07: qty 2

## 2019-05-07 MED ORDER — IBUPROFEN 800 MG PO TABS
ORAL_TABLET | ORAL | Status: AC
  Filled 2019-05-07: qty 1

## 2019-05-07 MED ORDER — GABAPENTIN 300 MG PO CAPS
300.0000 mg | ORAL_CAPSULE | Freq: Two times a day (BID) | ORAL | Status: DC
  Administered 2019-05-07 (×2): 300 mg via ORAL
  Filled 2019-05-07: qty 1

## 2019-05-07 MED ORDER — LIDOCAINE 2% (20 MG/ML) 5 ML SYRINGE
INTRAMUSCULAR | Status: AC
  Filled 2019-05-07: qty 5

## 2019-05-07 MED ORDER — MEPERIDINE HCL 25 MG/ML IJ SOLN
6.2500 mg | INTRAMUSCULAR | Status: DC | PRN
  Filled 2019-05-07: qty 1

## 2019-05-07 MED ORDER — ACETAMINOPHEN 325 MG PO TABS
325.0000 mg | ORAL_TABLET | Freq: Once | ORAL | Status: DC | PRN
  Filled 2019-05-07: qty 2

## 2019-05-07 MED ORDER — ACETAMINOPHEN 10 MG/ML IV SOLN
1000.0000 mg | Freq: Once | INTRAVENOUS | Status: AC | PRN
  Filled 2019-05-07: qty 100

## 2019-05-07 MED ORDER — ROCURONIUM BROMIDE 10 MG/ML (PF) SYRINGE
PREFILLED_SYRINGE | INTRAVENOUS | Status: AC
  Filled 2019-05-07: qty 10

## 2019-05-07 MED ORDER — MORPHINE SULFATE 2 MG/ML IV SOLN
INTRAVENOUS | Status: DC
  Administered 2019-05-07: 3 mg via INTRAVENOUS
  Administered 2019-05-07: 8 mg via INTRAVENOUS
  Administered 2019-05-07: 11:00:00 via INTRAVENOUS
  Administered 2019-05-08: 2 mg via INTRAVENOUS
  Filled 2019-05-07 (×2): qty 30

## 2019-05-07 MED ORDER — GABAPENTIN 300 MG PO CAPS
ORAL_CAPSULE | ORAL | Status: AC
  Filled 2019-05-07: qty 1

## 2019-05-07 MED ORDER — SODIUM CHLORIDE 0.9% FLUSH
9.0000 mL | INTRAVENOUS | Status: DC | PRN
  Filled 2019-05-07: qty 10

## 2019-05-07 MED ORDER — SUGAMMADEX SODIUM 200 MG/2ML IV SOLN
INTRAVENOUS | Status: DC | PRN
  Administered 2019-05-07: 120.2 mg via INTRAVENOUS

## 2019-05-07 MED ORDER — ONDANSETRON HCL 4 MG PO TABS
4.0000 mg | ORAL_TABLET | Freq: Four times a day (QID) | ORAL | Status: DC | PRN
  Filled 2019-05-07: qty 1

## 2019-05-07 MED ORDER — LIDOCAINE 2% (20 MG/ML) 5 ML SYRINGE
INTRAMUSCULAR | Status: DC | PRN
  Administered 2019-05-07: 1 mg/kg/h via INTRAVENOUS

## 2019-05-07 MED ORDER — ROCURONIUM BROMIDE 50 MG/5ML IV SOSY
PREFILLED_SYRINGE | INTRAVENOUS | Status: DC | PRN
  Administered 2019-05-07: 50 mg via INTRAVENOUS

## 2019-05-07 MED ORDER — DEXAMETHASONE SODIUM PHOSPHATE 10 MG/ML IJ SOLN
INTRAMUSCULAR | Status: AC
  Filled 2019-05-07: qty 1

## 2019-05-07 MED ORDER — KETOROLAC TROMETHAMINE 30 MG/ML IJ SOLN
30.0000 mg | Freq: Once | INTRAMUSCULAR | Status: AC
  Administered 2019-05-07: 15:00:00 30 mg via INTRAVENOUS
  Filled 2019-05-07: qty 1

## 2019-05-07 MED ORDER — FENTANYL CITRATE (PF) 250 MCG/5ML IJ SOLN
INTRAMUSCULAR | Status: AC
  Filled 2019-05-07: qty 5

## 2019-05-07 MED ORDER — DEXAMETHASONE SODIUM PHOSPHATE 10 MG/ML IJ SOLN
INTRAMUSCULAR | Status: DC | PRN
  Administered 2019-05-07: 10 mg via INTRAVENOUS

## 2019-05-07 MED ORDER — DIPHENHYDRAMINE HCL 50 MG/ML IJ SOLN
12.5000 mg | Freq: Four times a day (QID) | INTRAMUSCULAR | Status: DC | PRN
  Filled 2019-05-07: qty 0.25

## 2019-05-07 MED ORDER — PROPOFOL 10 MG/ML IV BOLUS
INTRAVENOUS | Status: AC
  Filled 2019-05-07: qty 40

## 2019-05-07 MED ORDER — ACETAMINOPHEN 160 MG/5ML PO SOLN
325.0000 mg | Freq: Once | ORAL | Status: DC | PRN
  Filled 2019-05-07: qty 20.3

## 2019-05-07 MED ORDER — ONDANSETRON HCL 4 MG/2ML IJ SOLN
INTRAMUSCULAR | Status: DC | PRN
  Administered 2019-05-07: 4 mg via INTRAVENOUS

## 2019-05-07 MED ORDER — BUPIVACAINE LIPOSOME 1.3 % IJ SUSP
INTRAMUSCULAR | Status: DC | PRN
  Administered 2019-05-07: 20 mL

## 2019-05-07 MED ORDER — ONDANSETRON HCL 4 MG/2ML IJ SOLN
4.0000 mg | Freq: Four times a day (QID) | INTRAMUSCULAR | Status: DC | PRN
  Filled 2019-05-07: qty 2

## 2019-05-07 MED ORDER — IBUPROFEN 800 MG PO TABS
800.0000 mg | ORAL_TABLET | Freq: Three times a day (TID) | ORAL | Status: DC
  Administered 2019-05-07 – 2019-05-08 (×2): 800 mg via ORAL
  Filled 2019-05-07: qty 1

## 2019-05-07 MED ORDER — BUPIVACAINE HCL (PF) 0.25 % IJ SOLN
INTRAMUSCULAR | Status: DC | PRN
  Administered 2019-05-07 (×2): 15 mL

## 2019-05-07 MED ORDER — LIDOCAINE 2% (20 MG/ML) 5 ML SYRINGE
INTRAMUSCULAR | Status: DC | PRN
  Administered 2019-05-07: 50 mg via INTRAVENOUS

## 2019-05-07 MED ORDER — PROPOFOL 500 MG/50ML IV EMUL
INTRAVENOUS | Status: AC
  Filled 2019-05-07: qty 50

## 2019-05-07 MED ORDER — KETOROLAC TROMETHAMINE 30 MG/ML IJ SOLN
INTRAMUSCULAR | Status: DC | PRN
  Administered 2019-05-07: 30 mg via INTRAVENOUS

## 2019-05-07 MED ORDER — DIPHENHYDRAMINE HCL 12.5 MG/5ML PO ELIX
12.5000 mg | ORAL_SOLUTION | Freq: Four times a day (QID) | ORAL | Status: DC | PRN
  Filled 2019-05-07: qty 5

## 2019-05-07 MED ORDER — ONDANSETRON HCL 4 MG/2ML IJ SOLN
INTRAMUSCULAR | Status: AC
  Filled 2019-05-07: qty 2

## 2019-05-07 MED ORDER — ONDANSETRON HCL 4 MG/2ML IJ SOLN
4.0000 mg | Freq: Four times a day (QID) | INTRAMUSCULAR | Status: DC | PRN
  Administered 2019-05-07: 20:00:00 4 mg via INTRAVENOUS
  Filled 2019-05-07: qty 2

## 2019-05-07 MED ORDER — KETOROLAC TROMETHAMINE 30 MG/ML IJ SOLN
INTRAMUSCULAR | Status: AC
  Filled 2019-05-07: qty 1

## 2019-05-07 MED ORDER — PROMETHAZINE HCL 25 MG/ML IJ SOLN
6.2500 mg | INTRAMUSCULAR | Status: DC | PRN
  Filled 2019-05-07: qty 1

## 2019-05-07 MED ORDER — PROPOFOL 10 MG/ML IV BOLUS
INTRAVENOUS | Status: DC | PRN
  Administered 2019-05-07: 130 mg via INTRAVENOUS
  Administered 2019-05-07: 20 mg via INTRAVENOUS

## 2019-05-07 MED ORDER — MENTHOL 3 MG MT LOZG
1.0000 | LOZENGE | OROMUCOSAL | Status: DC | PRN
  Filled 2019-05-07: qty 9

## 2019-05-07 MED ORDER — SODIUM CHLORIDE 0.9 % IV SOLN
INTRAVENOUS | Status: AC
  Filled 2019-05-07: qty 2

## 2019-05-07 MED ORDER — ACETAMINOPHEN 500 MG PO TABS
ORAL_TABLET | ORAL | Status: AC
  Filled 2019-05-07: qty 2

## 2019-05-07 MED ORDER — LACTATED RINGERS IV SOLN
INTRAVENOUS | Status: DC
  Administered 2019-05-07: 10:00:00 via INTRAVENOUS
  Filled 2019-05-07 (×2): qty 1000

## 2019-05-07 MED ORDER — ZOLPIDEM TARTRATE 5 MG PO TABS
5.0000 mg | ORAL_TABLET | Freq: Every evening | ORAL | Status: DC | PRN
  Filled 2019-05-07: qty 1

## 2019-05-07 MED ORDER — FENTANYL CITRATE (PF) 100 MCG/2ML IJ SOLN
INTRAMUSCULAR | Status: DC | PRN
  Administered 2019-05-07 (×2): 50 ug via INTRAVENOUS
  Administered 2019-05-07: 100 ug via INTRAVENOUS
  Administered 2019-05-07: 50 ug via INTRAVENOUS

## 2019-05-07 MED ORDER — PROPOFOL 500 MG/50ML IV EMUL
INTRAVENOUS | Status: DC | PRN
  Administered 2019-05-07: 150 ug/kg/min via INTRAVENOUS

## 2019-05-07 MED ORDER — SODIUM CHLORIDE 0.9 % IV SOLN
2.0000 g | INTRAVENOUS | Status: AC
  Administered 2019-05-07: 2 g via INTRAVENOUS
  Filled 2019-05-07: qty 2

## 2019-05-07 MED ORDER — NALOXONE HCL 0.4 MG/ML IJ SOLN
0.4000 mg | INTRAMUSCULAR | Status: DC | PRN
  Filled 2019-05-07: qty 1

## 2019-05-07 MED ORDER — HYDROMORPHONE HCL 1 MG/ML IJ SOLN
INTRAMUSCULAR | Status: AC
  Filled 2019-05-07: qty 1

## 2019-05-07 MED ORDER — OXYCODONE-ACETAMINOPHEN 5-325 MG PO TABS
1.0000 | ORAL_TABLET | ORAL | Status: DC | PRN
  Administered 2019-05-08 (×3): 1 via ORAL
  Filled 2019-05-07: qty 2

## 2019-05-07 MED ORDER — LACTATED RINGERS IV SOLN
INTRAVENOUS | Status: DC
  Administered 2019-05-07: 07:00:00 via INTRAVENOUS
  Filled 2019-05-07: qty 1000

## 2019-05-07 MED ORDER — SUGAMMADEX SODIUM 200 MG/2ML IV SOLN: INTRAVENOUS | Status: DC | PRN

## 2019-05-07 MED ORDER — MIDAZOLAM HCL 2 MG/2ML IJ SOLN
INTRAMUSCULAR | Status: AC
  Filled 2019-05-07: qty 2

## 2019-05-07 SURGICAL SUPPLY — 52 items
BARRIER ADHS 3X4 INTERCEED (GAUZE/BANDAGES/DRESSINGS) IMPLANT
BENZOIN TINCTURE PRP APPL 2/3 (GAUZE/BANDAGES/DRESSINGS) ×3 IMPLANT
BLADE EXTENDED COATED 6.5IN (ELECTRODE) IMPLANT
BLADE HEX COATED 2.75 (ELECTRODE) ×3 IMPLANT
BRR ADH 4X3 ABS CNTRL BYND (GAUZE/BANDAGES/DRESSINGS)
CANISTER SUCT 3000ML PPV (MISCELLANEOUS) ×3 IMPLANT
CLOSURE WOUND 1/2 X4 (GAUZE/BANDAGES/DRESSINGS) ×1
CLOSURE WOUND 1/4X4 (GAUZE/BANDAGES/DRESSINGS)
COVER WAND RF STERILE (DRAPES) ×3 IMPLANT
DECANTER SPIKE VIAL GLASS SM (MISCELLANEOUS) IMPLANT
DRAPE WARM FLUID 44X44 (DRAPES) ×3 IMPLANT
DRSG OPSITE POSTOP 4X10 (GAUZE/BANDAGES/DRESSINGS) ×3 IMPLANT
DRSG PAD ABDOMINAL 8X10 ST (GAUZE/BANDAGES/DRESSINGS) ×3 IMPLANT
DRSG TELFA 4X8 ISLAND (GAUZE/BANDAGES/DRESSINGS) ×3 IMPLANT
DURAPREP 26ML APPLICATOR (WOUND CARE) ×3 IMPLANT
GAUZE SPONGE 4X4 12PLY STRL LF (GAUZE/BANDAGES/DRESSINGS) ×3 IMPLANT
GLOVE BIO SURGEON STRL SZ7 (GLOVE) ×6 IMPLANT
GOWN STRL REUS W/ TWL XL LVL3 (GOWN DISPOSABLE) ×3 IMPLANT
GOWN STRL REUS W/TWL XL LVL3 (GOWN DISPOSABLE) ×6
HEMOSTAT ARISTA ABSORB 3G PWDR (HEMOSTASIS) ×3 IMPLANT
HOLDER FOLEY CATH W/STRAP (MISCELLANEOUS) ×3 IMPLANT
KIT TURNOVER CYSTO (KITS) ×3 IMPLANT
MANIPULATOR UTERINE 4.5 ZUMI (MISCELLANEOUS) IMPLANT
NEEDLE FILTER BLUNT 18X 1/2SAF (NEEDLE) ×2
NEEDLE FILTER BLUNT 18X1 1/2 (NEEDLE) ×1 IMPLANT
NEEDLE HYPO 18GX1.5 BLUNT FILL (NEEDLE) IMPLANT
NEEDLE HYPO 22GX1.5 SAFETY (NEEDLE) ×3 IMPLANT
NEEDLE SPNL 22GX3.5 QUINCKE BK (NEEDLE) ×3 IMPLANT
NS IRRIG 500ML POUR BTL (IV SOLUTION) ×6 IMPLANT
PACK ABDOMINAL GYN (CUSTOM PROCEDURE TRAY) ×3 IMPLANT
PAD ABD 8X10 STRL (GAUZE/BANDAGES/DRESSINGS) ×3 IMPLANT
PAD OB MATERNITY 4.3X12.25 (PERSONAL CARE ITEMS) ×3 IMPLANT
SPONGE LAP 4X18 RFD (DISPOSABLE) ×3 IMPLANT
STRIP CLOSURE SKIN 1/2X4 (GAUZE/BANDAGES/DRESSINGS) ×2 IMPLANT
STRIP CLOSURE SKIN 1/4X4 (GAUZE/BANDAGES/DRESSINGS) IMPLANT
SUT GUT CHROMIC 3 0 (SUTURE) ×3 IMPLANT
SUT MNCRL AB 4-0 PS2 18 (SUTURE) ×3 IMPLANT
SUT PDS AB 0 CT1 27 (SUTURE) ×6 IMPLANT
SUT VIC AB 0 CT1 18XCR BRD8 (SUTURE) ×3 IMPLANT
SUT VIC AB 0 CT1 36 (SUTURE) ×3 IMPLANT
SUT VIC AB 0 CT1 8-18 (SUTURE) ×9
SUT VIC AB 2-0 CT1 (SUTURE) ×3 IMPLANT
SUT VIC AB 3-0 CT1 27 (SUTURE) ×2
SUT VIC AB 3-0 CT1 TAPERPNT 27 (SUTURE) ×1 IMPLANT
SUT VICRYL 0 TIES 12 18 (SUTURE) ×3 IMPLANT
SYR 10ML LL (SYRINGE) ×6 IMPLANT
SYR 30ML LL (SYRINGE) ×3 IMPLANT
SYR CONTROL 10ML LL (SYRINGE) ×3 IMPLANT
SYRINGE 60CC LL (MISCELLANEOUS) IMPLANT
TAPE STRIPS DRAPE STRL (GAUZE/BANDAGES/DRESSINGS) ×3 IMPLANT
TOWEL OR 17X26 10 PK STRL BLUE (TOWEL DISPOSABLE) ×3 IMPLANT
WATER STERILE IRR 500ML POUR (IV SOLUTION) ×3 IMPLANT

## 2019-05-07 NOTE — Anesthesia Procedure Notes (Signed)
Procedure Name: Intubation Date/Time: 05/07/2019 7:28 AM Performed by: Bonney Aid, CRNA Pre-anesthesia Checklist: Patient identified, Emergency Drugs available, Suction available and Patient being monitored Patient Re-evaluated:Patient Re-evaluated prior to induction Oxygen Delivery Method: Circle system utilized Preoxygenation: Pre-oxygenation with 100% oxygen Induction Type: IV induction Ventilation: Mask ventilation without difficulty Laryngoscope Size: Mac and 3 Grade View: Grade I Tube type: Oral Tube size: 7.0 mm Number of attempts: 1 Airway Equipment and Method: Stylet and Oral airway Placement Confirmation: ETT inserted through vocal cords under direct vision,  positive ETCO2 and breath sounds checked- equal and bilateral Secured at: 21 cm Tube secured with: Tape Dental Injury: Teeth and Oropharynx as per pre-operative assessment

## 2019-05-07 NOTE — Anesthesia Postprocedure Evaluation (Signed)
Anesthesia Post Note  Patient: Lisa West  Procedure(s) Performed: HYSTERECTOMY ABDOMINAL with salpingectomy (Bilateral Abdomen)     Patient location during evaluation: PACU Anesthesia Type: General Level of consciousness: awake and alert Pain management: pain level not controlled Vital Signs Assessment: post-procedure vital signs reviewed and stable Respiratory status: spontaneous breathing, nonlabored ventilation, respiratory function stable and patient connected to nasal cannula oxygen Cardiovascular status: blood pressure returned to baseline and stable Postop Assessment: no apparent nausea or vomiting Anesthetic complications: no Comments: Continuing pain management.     Last Vitals:  Vitals:   05/07/19 1103 05/07/19 1115  BP: (!) 141/93 (!) 145/95  Pulse: 77 75  Resp: 16 16  Temp: 36.8 C   SpO2: 100% 100%                  Effie Berkshire

## 2019-05-07 NOTE — Progress Notes (Signed)
The patient was re-examined with no change in status, seen in ED this past weekend with 2 u PRBC, bleeding improved since then, will chk pre-op Hgb

## 2019-05-07 NOTE — Transfer of Care (Signed)
Immediate Anesthesia Transfer of Care Note  Patient: SIFA TROEGER  Procedure(s) Performed: HYSTERECTOMY ABDOMINAL with salpingectomy (Bilateral Abdomen)  Patient Location: PACU  Anesthesia Type:General  Level of Consciousness: awake, alert  and oriented  Airway & Oxygen Therapy: Patient Spontanous Breathing and Patient connected to nasal cannula oxygen  Post-op Assessment: Report given to RN  Post vital signs: Reviewed and stable  Last Vitals:  Vitals Value Taken Time  BP 134/98 05/07/19 0852  Temp    Pulse 82 05/07/19 0855  Resp 24 05/07/19 0855  SpO2 100 % 05/07/19 0855  Vitals shown include unvalidated device data.  Last Pain:  Vitals:   05/07/19 0611  TempSrc: Oral         Complications: No apparent anesthesia complications

## 2019-05-07 NOTE — Anesthesia Procedure Notes (Signed)
Anesthesia Regional Block: TAP block   Pre-Anesthetic Checklist: ,, timeout performed, Correct Patient, Correct Site, Correct Laterality, Correct Procedure, Correct Position, site marked, Risks and benefits discussed,  Surgical consent,  Pre-op evaluation,  At surgeon's request and post-op pain management  Laterality: Right  Prep: chloraprep       Needles:  Injection technique: Single-shot  Needle Type: Echogenic Stimulator Needle     Needle Length: 9cm  Needle Gauge: 21     Additional Needles:   Procedures:,,,, ultrasound used (permanent image in chart),,,,  Narrative:  Start time: 05/07/2019 9:50 AM End time: 05/07/2019 10:00 AM Injection made incrementally with aspirations every 5 mL.  Performed by: Personally  Anesthesiologist: Effie Berkshire, MD  Additional Notes: Patient tolerated the procedure well. Local anesthetic introduced in an incremental fashion under minimal resistance after negative aspirations. No paresthesias were elicited. After completion of the procedure, no acute issues were identified and patient continued to be monitored by RN.     RESCUE BLOCK IN PACU FOR 10/10 PAIN.

## 2019-05-07 NOTE — Anesthesia Procedure Notes (Signed)
Anesthesia Regional Block: TAP block   Pre-Anesthetic Checklist: ,, timeout performed, Correct Patient, Correct Site, Correct Laterality, Correct Procedure, Correct Position, site marked, Risks and benefits discussed,  Surgical consent,  Pre-op evaluation,  At surgeon's request and post-op pain management  Laterality: Left  Prep: chloraprep       Needles:  Injection technique: Single-shot  Needle Type: Echogenic Stimulator Needle     Needle Length: 9cm  Needle Gauge: 21     Additional Needles:   Procedures:,,,, ultrasound used (permanent image in chart),,,,  Narrative:  Start time: 05/07/2019 10:00 AM End time: 05/07/2019 10:08 AM Injection made incrementally with aspirations every 5 mL.  Performed by: Personally  Anesthesiologist: Effie Berkshire, MD  Additional Notes: Patient tolerated the procedure well. Local anesthetic introduced in an incremental fashion under minimal resistance after negative aspirations. No paresthesias were elicited. After completion of the procedure, no acute issues were identified and patient continued to be monitored by RN.    RESCUE BLOCK IN PACU FOR 10/10 PAIN.

## 2019-05-07 NOTE — Op Note (Signed)
Preoperative diagnosis: Leiomyoma, symptomatic with menorrhagia  Postoperative diagnosis: Same  Procedure: TAH, bilateral salpingectomy  Surgeon: Matthew Saras  Assistant: Low  EBL: 150 cc  Procedure and findings:  The patient was taken to the operating room after an adequate level of general anesthesia was obtained the abdomen and vagina were prepped and draped and appropriate timeouts were taken.  Foley catheter was position.  Transverse incision made through her old scar this was carried down the fascia which was incised and extended transversely.  Rectus muscles divided in the midline, peritoneum entered superiorly without incident and extended in a vertical fashion.  Uterus was enlarged 12 weeks size with a fundal and a large posterior fibroid noted.  Retractors were not used, the uterus was elevated out of the incision using Deaver retractors for exposure, long Kelly clamps placed at each utero-ovarian pedicle starting on the left the round ligament clamped divided and suture-ligated the anterior peritoneum carried around to the midline, the utero-ovarian pedicle was isolated clamped cut first free tie followed by suture ligature 0 Vicryl.  A portion of tube was then removed and secured with a ligature.  Same repeated on the opposite side, bilateral salpingectomy with conservation of both ovaries which were otherwise normal.  Sharp and blunt dissection was used to dissected the bladder flap close to the uterus, once this was below the cervical vaginal junction, the ascending branch of the uterine artery on the left was clamped divided and suture-ligated same repeated on the opposite side.  In sequential manner the cardinal ligament uterosacral ligament and cervical vaginal pedicles were clamped divided and suture-ligated with 0 Vicryl suture and the specimen was excised.  Vaginal cuff was closed with interrupted 2-0 Vicryl sutures.  Pelvis was irrigated and noted to be hemostatic Arista was placed at  the vaginal cuff.  Prior to closure sponge, needle, instrument counts reported as correct x2.  The upper abdomen including the appendix were inspected and noted to be normal.  Peritoneum closed with a running 2-0 Vicryl suture the same used to reapproximate the rectus muscles in the midline.  0 PDS was then used from laterally to midline on either side to close the fascia.  Subcutaneous tissue was undermined to reduce tension this was made hemostatic with the Bovie and noted to be hemostatic after irrigation.  Diluted Exparel solution was then injected per protocol subfascially and subcutaneous for postoperative analgesia.  The subcutaneous layer was fairly thin was not closed separately 4-0 Monocryl subcuticular subcuticular closure with Steri-Strips and a pressure dressing.  Clear urine noted at the end of the case.  She went to recovery room in good condition.  Dictated with Dragon Medical 1  Margarette Asal MD

## 2019-05-08 DIAGNOSIS — N920 Excessive and frequent menstruation with regular cycle: Secondary | ICD-10-CM | POA: Diagnosis not present

## 2019-05-08 DIAGNOSIS — Z79899 Other long term (current) drug therapy: Secondary | ICD-10-CM | POA: Diagnosis not present

## 2019-05-08 DIAGNOSIS — N800 Endometriosis of uterus: Secondary | ICD-10-CM | POA: Diagnosis not present

## 2019-05-08 DIAGNOSIS — D259 Leiomyoma of uterus, unspecified: Secondary | ICD-10-CM | POA: Diagnosis not present

## 2019-05-08 DIAGNOSIS — Z793 Long term (current) use of hormonal contraceptives: Secondary | ICD-10-CM | POA: Diagnosis not present

## 2019-05-08 LAB — CBC
HCT: 31.4 % — ABNORMAL LOW (ref 36.0–46.0)
Hemoglobin: 10.2 g/dL — ABNORMAL LOW (ref 12.0–15.0)
MCH: 30.4 pg (ref 26.0–34.0)
MCHC: 32.5 g/dL (ref 30.0–36.0)
MCV: 93.7 fL (ref 80.0–100.0)
Platelets: 217 10*3/uL (ref 150–400)
RBC: 3.35 MIL/uL — ABNORMAL LOW (ref 3.87–5.11)
RDW: 13.4 % (ref 11.5–15.5)
WBC: 12.7 10*3/uL — ABNORMAL HIGH (ref 4.0–10.5)
nRBC: 0 % (ref 0.0–0.2)

## 2019-05-08 LAB — SURGICAL PATHOLOGY

## 2019-05-08 MED ORDER — IBUPROFEN 800 MG PO TABS: 800.0000 mg | ORAL_TABLET | Freq: Three times a day (TID) | ORAL | 1 refills | Status: DC | PRN

## 2019-05-08 MED ORDER — OXYCODONE-ACETAMINOPHEN 5-325 MG PO TABS
ORAL_TABLET | ORAL | Status: AC
  Filled 2019-05-08: qty 1

## 2019-05-08 MED ORDER — IBUPROFEN 800 MG PO TABS
ORAL_TABLET | ORAL | Status: AC
  Filled 2019-05-08: qty 1

## 2019-05-08 MED ORDER — OXYCODONE-ACETAMINOPHEN 5-325 MG PO TABS: 1.0000 | ORAL_TABLET | ORAL | 0 refills | Status: DC | PRN

## 2019-05-08 MED FILL — IBUPROFEN 800 MG TABS: 800 | 10 days supply | Qty: 30 | Fill #0

## 2019-05-08 MED FILL — OXYCODONE-ACETAMINOPHEN 5-3: 5-325 | 3 days supply | Qty: 20 | Fill #0

## 2019-05-08 NOTE — Progress Notes (Signed)
Morphine PCA d/c 16 cc wasted Reynold Bowen witnessed

## 2019-05-08 NOTE — Discharge Summary (Signed)
Physician Discharge Summary  Patient ID: Lisa West MRN: [name] DOB/AGE: Aug 15, 1970 48 y.o.  Admit date: 05/07/2019 Discharge date: 05/08/2019  Admission Diagnoses:leiomyoma, menorrhagia  Discharge Diagnoses: same Active Problems:   Leiomyoma   Discharged Condition: good  Hospital Course: adm for TAH/BS for menorrhagia/leiomyoma. On POD 1, afeb, tol PO and ready for D/C  Consults: None  Significant Diagnostic Studies: labs:  Results for orders placed or performed during the hospital encounter of 05/07/19 (from the past 48 hour(s))  Pregnancy, urine POC     Status: None   Collection Time: 05/07/19  5:49 AM  Result Value Ref Range   Preg Test, Ur NEGATIVE NEGATIVE    Comment:        THE SENSITIVITY OF THIS METHODOLOGY IS >24 mIU/mL   Type and screen [facility]     Status: None   Collection Time: 05/07/19  6:30 AM  Result Value Ref Range   ABO/RH(D) O POS    Antibody Screen NEG    Sample Expiration      05/10/2019,2359 Performed at [facility], [place] [address] [place], [place] [address]   CBC     Status: Abnormal   Collection Time: 05/08/19  4:20 AM  Result Value Ref Range   WBC 12.7 (H) 4.0 - 10.5 K/uL   RBC 3.35 (L) 3.87 - 5.11 MIL/uL   Hemoglobin 10.2 (L) 12.0 - 15.0 g/dL   HCT 31.4 (L) 36.0 - 46.0 %   MCV 93.7 80.0 - 100.0 fL   MCH 30.4 26.0 - 34.0 pg   MCHC 32.5 30.0 - 36.0 g/dL   RDW 13.4 11.5 - 15.5 %   Platelets 217 150 - 400 K/uL   nRBC 0.0 0.0 - 0.2 %    Comment: Performed at [facility], [place] [address], [place], [place] [address]    Treatments: surgery: TAH, bilat salpingectomy  Discharge Exam: Blood pressure 109/76, pulse 78, temperature 98.7 F (37.1 C), resp. rate 16, height 5\' 2"  (1.575 m), weight 60.1 kg, last menstrual period 05/03/2019, SpO2 99 %. General appearance: alert GI: soft + BS, dressing dry  Disposition: Discharge disposition: 01-Home or Self  Care         Follow-up Information    Molli Posey, MD. Schedule an appointment as soon as possible for a visit in 1 week(s).   Specialty: Obstetrics and Gynecology Contact information: [place] [address]  [id number] [phone]           Signed: Margarette Asal 05/08/2019, 8:03 AM

## 2019-05-08 NOTE — Addendum Note (Signed)
Addendum  created 05/08/19 0907 by Bonney Aid, CRNA   Charge Capture section accepted

## 2019-05-09 ENCOUNTER — Encounter (HOSPITAL_BASED_OUTPATIENT_CLINIC_OR_DEPARTMENT_OTHER): Payer: Self-pay | Admitting: Obstetrics and Gynecology

## 2019-05-09 LAB — POCT HEMOGLOBIN-HEMACUE: Hemoglobin: 10.9 g/dL — ABNORMAL LOW (ref 12.0–15.0)

## 2019-05-14 NOTE — Addendum Note (Signed)
Addendum  created 05/14/19 1206 by Wanita Chamberlain, CRNA   Charge Capture section accepted

## 2019-05-16 ENCOUNTER — Telehealth (INDEPENDENT_AMBULATORY_CARE_PROVIDER_SITE_OTHER): Payer: 59 | Admitting: Gastroenterology

## 2019-05-16 DIAGNOSIS — D649 Anemia, unspecified: Secondary | ICD-10-CM | POA: Diagnosis not present

## 2019-05-16 DIAGNOSIS — G8929 Other chronic pain: Secondary | ICD-10-CM

## 2019-05-16 DIAGNOSIS — M7502 Adhesive capsulitis of left shoulder: Secondary | ICD-10-CM

## 2019-05-16 DIAGNOSIS — M7542 Impingement syndrome of left shoulder: Secondary | ICD-10-CM

## 2019-05-16 DIAGNOSIS — Z09 Encounter for follow-up examination after completed treatment for conditions other than malignant neoplasm: Secondary | ICD-10-CM | POA: Diagnosis not present

## 2019-05-16 DIAGNOSIS — M25512 Pain in left shoulder: Secondary | ICD-10-CM

## 2019-05-16 NOTE — Telephone Encounter (Signed)
Called and spoke with patient-patient reports she has not been taking the Prilosec since April as it was "not working for my symptoms" -patient does not have symptoms after laying down to sleep -symptoms return about an hour after waking up  Please advise if her post nasal drip is causing the burning/irritation at the back of her throat  Patient is requesting to have GERD removed from her record as she "does not have that"...

## 2019-05-16 NOTE — Telephone Encounter (Signed)
I am assuming what she means is that the trial of high-dose Prilosec did not improve her cough symptoms at all?  If that is the case, and combined with her normal EGD earlier this year, would be reasonable for her to have stopped the PPI in favor of evaluation for alternate etiology.  Its entirely possible that postnasal drip is a cause for vocal cord irritation and subsequent chronic cough.  Could benefit from referral to ENT.    I am always happy to see her in follow-up in the GI clinic.  If we think reflux still plays a role in her symptoms, the next step would likely be for a pH/impedance study to be done off all acid suppression therapy.  As far as her records, to the best of my knowledge, I would be able to remove reflux from her problem list during appointment.  Otherwise, I am not sure how to do that outside of the clinic encounter.  Thanks.

## 2019-05-16 NOTE — Telephone Encounter (Signed)
Called and spoke with patient-patient given information from provider and requests to find her own ENT and will notify the office if she needs a f/u appt; Patient advised to call back to the office at [phone] should questions/concerns arise;  Patient verbalized understanding of information/instructions;

## 2019-05-20 DIAGNOSIS — G43909 Migraine, unspecified, not intractable, without status migrainosus: Secondary | ICD-10-CM | POA: Diagnosis not present

## 2019-05-20 DIAGNOSIS — J309 Allergic rhinitis, unspecified: Secondary | ICD-10-CM | POA: Diagnosis not present

## 2019-05-20 DIAGNOSIS — R030 Elevated blood-pressure reading, without diagnosis of hypertension: Secondary | ICD-10-CM | POA: Diagnosis not present

## 2019-05-20 DIAGNOSIS — R002 Palpitations: Secondary | ICD-10-CM | POA: Diagnosis not present

## 2019-05-20 DIAGNOSIS — Z7689 Persons encountering health services in other specified circumstances: Secondary | ICD-10-CM | POA: Diagnosis not present

## 2019-06-18 DIAGNOSIS — R519 Headache, unspecified: Secondary | ICD-10-CM | POA: Diagnosis not present

## 2019-06-18 DIAGNOSIS — H6983 Other specified disorders of Eustachian tube, bilateral: Secondary | ICD-10-CM | POA: Diagnosis not present

## 2019-06-18 MED FILL — FLUTICASONE PROP 50 MCG SPR: 50 | 30 days supply | Qty: 16 | Fill #0

## 2019-06-25 DIAGNOSIS — R519 Headache, unspecified: Secondary | ICD-10-CM | POA: Diagnosis not present

## 2019-06-25 DIAGNOSIS — R030 Elevated blood-pressure reading, without diagnosis of hypertension: Secondary | ICD-10-CM | POA: Diagnosis not present

## 2019-06-25 DIAGNOSIS — R002 Palpitations: Secondary | ICD-10-CM | POA: Diagnosis not present

## 2019-06-26 ENCOUNTER — Other Ambulatory Visit: Payer: Self-pay | Admitting: Surgery

## 2019-06-26 DIAGNOSIS — M7502 Adhesive capsulitis of left shoulder: Secondary | ICD-10-CM

## 2019-06-26 DIAGNOSIS — G8929 Other chronic pain: Secondary | ICD-10-CM

## 2019-06-26 NOTE — Telephone Encounter (Signed)
Today I ran into 48 year old white female while I was in the OR.  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  She was seen by me in the clinic January 24, 2019 for her left shoulder and I performed a subacromial Marcaine/Depo-Medrol injection.  States that this gave temporary improvement for a few weeks.  Shoulder has been progressively getting worse over the last couple of months.  No cervical spine or radicular component.  Pain worse with overactivity and reaching behind her back.  Exam Very pleasant white female alert and oriented in no acute distress.  Cervical spine unremarkable.  Left shoulder positive impingement test.  Negative drop arm test.  Shoulder flexion/abduction to about 140-150 degrees.  Marked limitation with internal rotation behind her back.   Assessment Progressively worsening left shoulder pain secondary to impingement and adhesive capsulitis..  Question rotator cuff tear.  Plan Since patient has failed conservative treatment with activity modification, previous subacromial Marcaine/Depo-Medrol injection and symptoms are worsening I recommend getting an MRI of the left shoulder as I previously discussed January 24, 2019.  I will have patient follow-up with Dr. Alphonzo Severance in our office to review the study and discuss further treatment options at that point.  All questions answered.

## 2019-07-02 ENCOUNTER — Telehealth: Payer: Self-pay | Admitting: Orthopedic Surgery

## 2019-07-02 NOTE — Telephone Encounter (Signed)
Called patient got recording mailbox is full and could not leave message     Call was disconnected  Will try back again

## 2019-07-04 ENCOUNTER — Other Ambulatory Visit: Payer: Self-pay

## 2019-07-04 ENCOUNTER — Ambulatory Visit
Admission: RE | Admit: 2019-07-04 | Discharge: 2019-07-04 | Disposition: A | Discharge status: Home / Self Care | Payer: 59 | Source: Ambulatory Visit | Attending: Surgery | Admitting: Surgery

## 2019-07-04 DIAGNOSIS — G8929 Other chronic pain: Secondary | ICD-10-CM

## 2019-07-04 DIAGNOSIS — M25512 Pain in left shoulder: Secondary | ICD-10-CM

## 2019-07-04 DIAGNOSIS — M7502 Adhesive capsulitis of left shoulder: Secondary | ICD-10-CM

## 2019-07-04 DIAGNOSIS — M25812 Other specified joint disorders, left shoulder: Secondary | ICD-10-CM | POA: Diagnosis not present

## 2019-07-04 DIAGNOSIS — R002 Palpitations: Secondary | ICD-10-CM | POA: Diagnosis not present

## 2019-07-04 IMAGING — MR MR SHOULDER*L* W/O CM
4 of 5 series · 28 of 40 positions shown · non-contrast
Comparison: Plain films left shoulder [DATE].

CLINICAL DATA: Left shoulder pain and limited range of motion and
weakness since [DATE]. No known injury.

EXAM:
MRI OF THE LEFT SHOULDER WITHOUT CONTRAST
TECHNIQUE: Multiplanar, multisequence MR imaging of the shoulder was performed.
No intravenous contrast was administered.

[Series 3: PD fat-sat · axial · 4.0mm · 0.27mm/px · z∈[-41,+35]mm · 9 of 17 slices shown (1 of 2)]
[im 1/17]
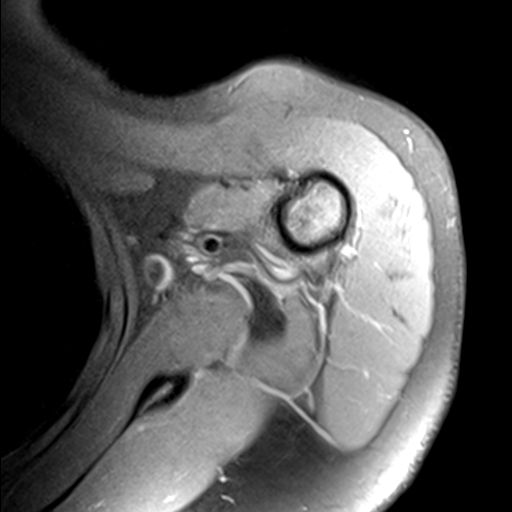
[im 3/17]
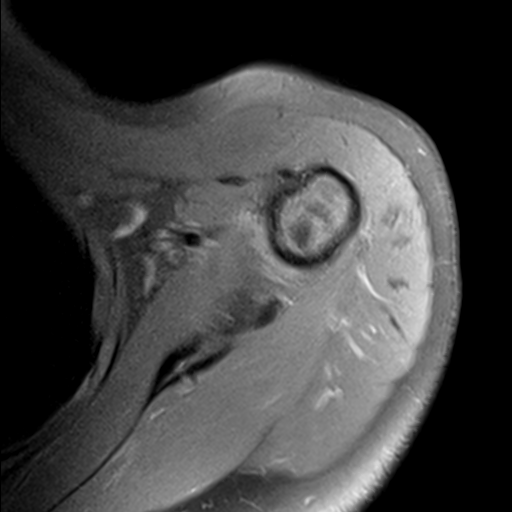
[im 5/17]
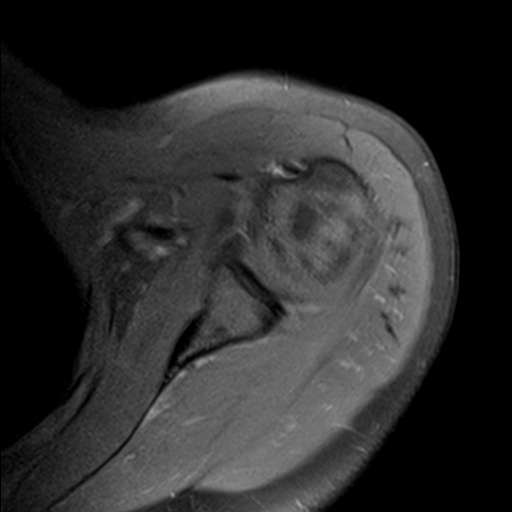
[im 7/17]
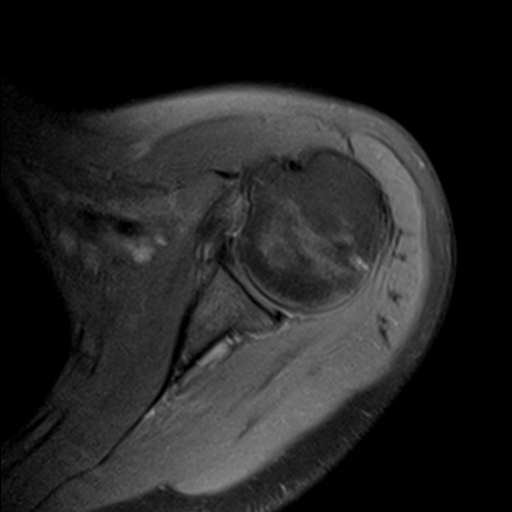
[im 9/17]
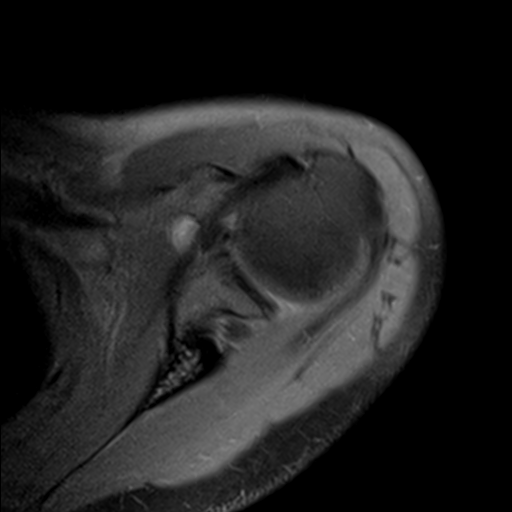
[im 11/17]
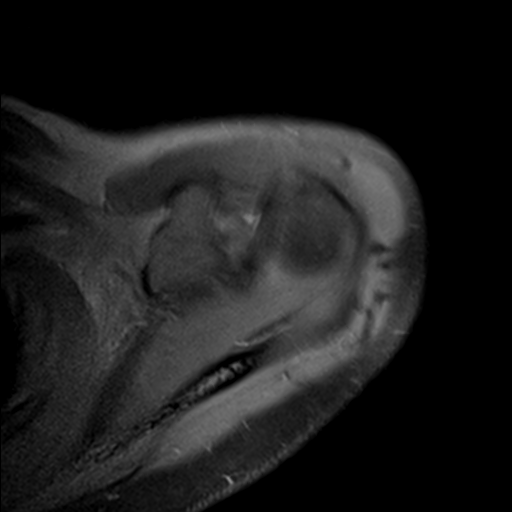
[im 13/17]
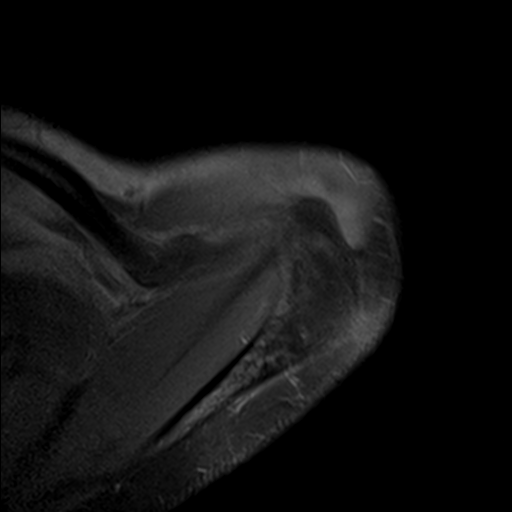
[im 15/17]
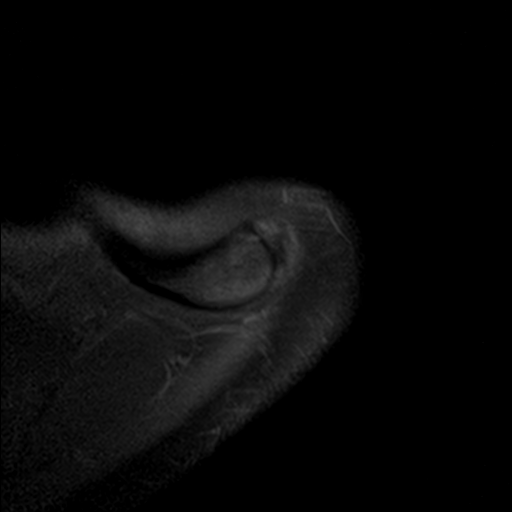
[im 17/17]
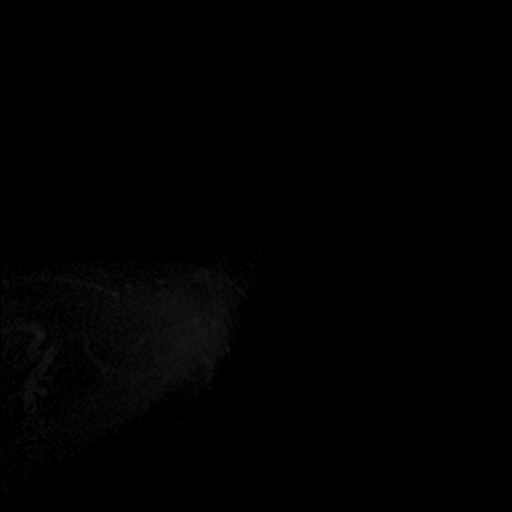

[Series 4: T2 fat-sat · oblique · 4.0mm · 0.55mm/px · 8 of 15 slices shown (1 of 2)]
[im 1/15]
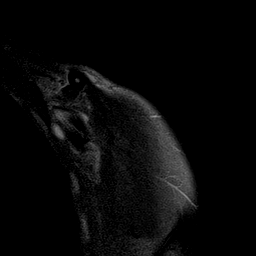
[im 3/15]
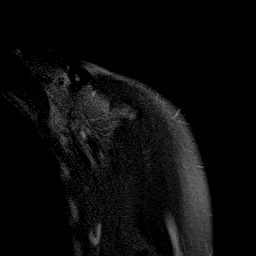
[im 5/15]
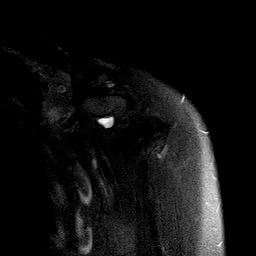
[im 7/15]
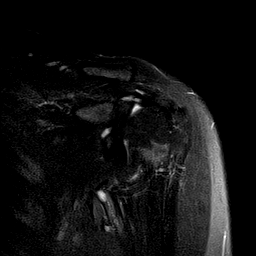
[im 9/15]
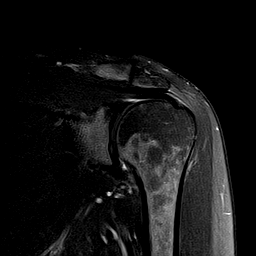
[im 11/15]
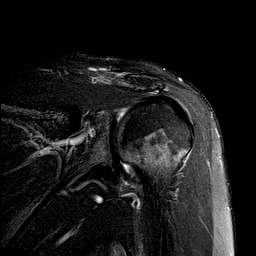
[im 13/15]
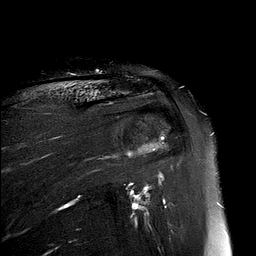
[im 15/15]
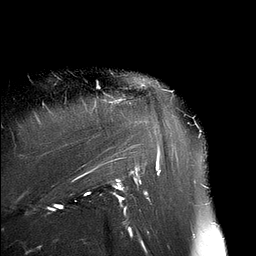

[Series 5: PD fat-sat · oblique · 4.0mm · 0.27mm/px · 7 of 15 slices shown (2 of 2)]
[im 1/15]
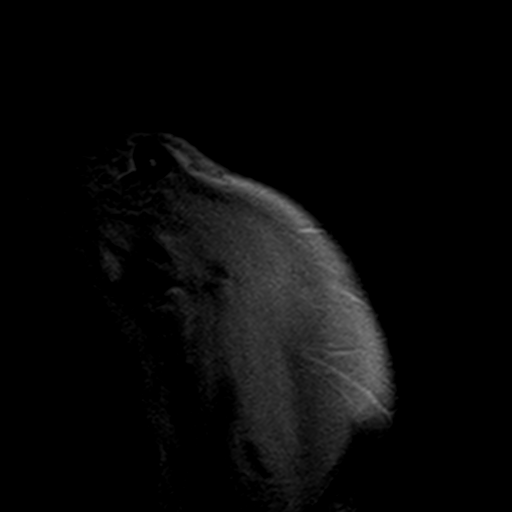
[im 3/15]
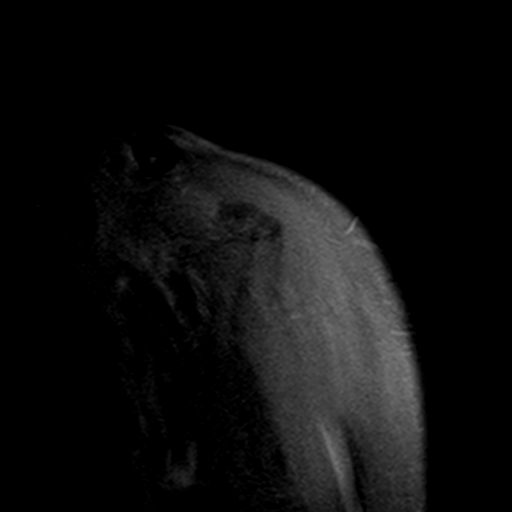
[im 5/15]
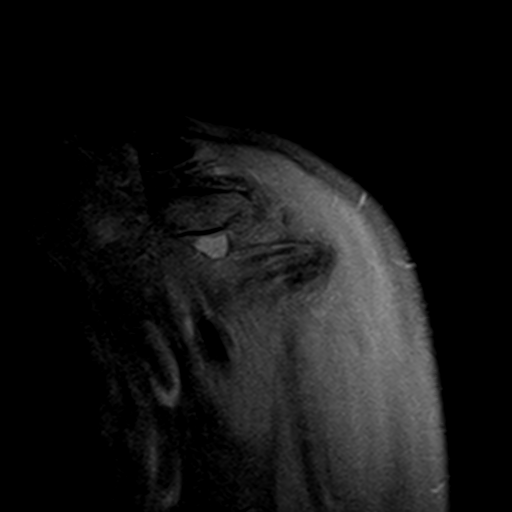
[im 8/15]
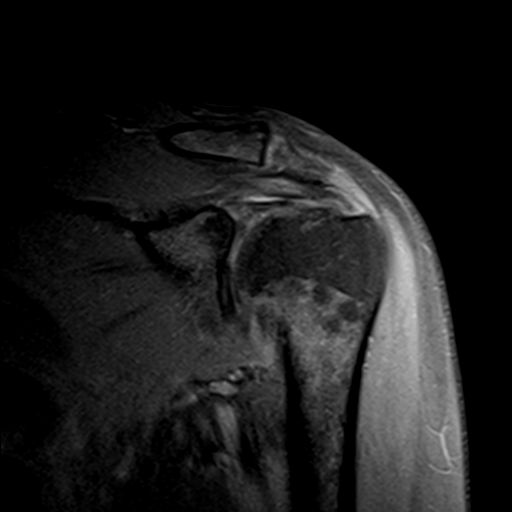
[im 10/15]
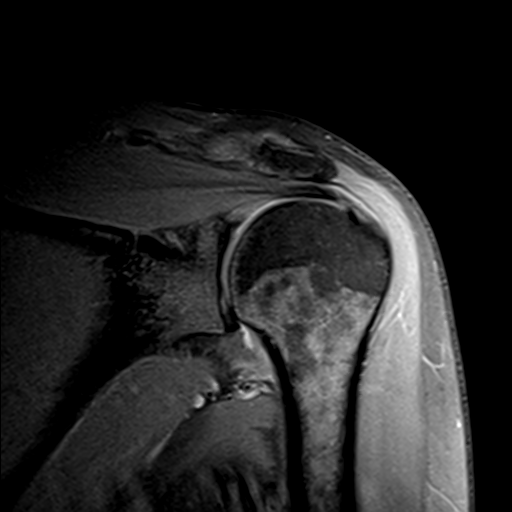
[im 12/15]
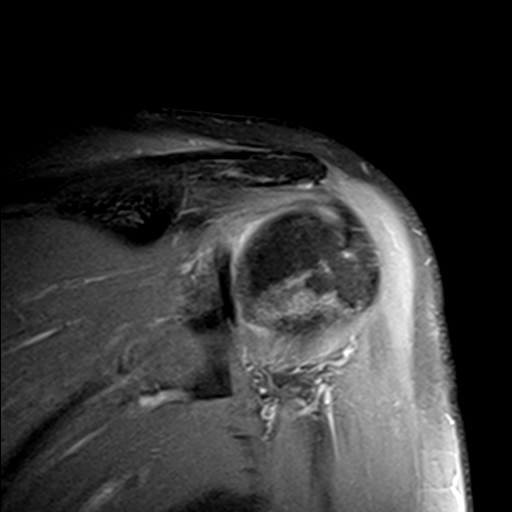
[im 15/15]
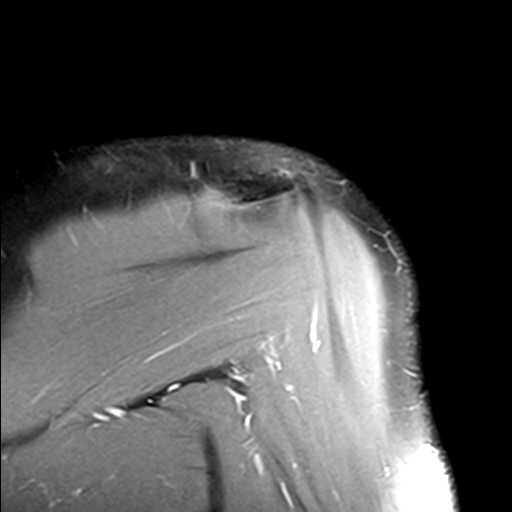

[Series 8: T2 fat-sat · oblique · 4.0mm · 0.55mm/px · 4 of 17 slices shown (2 of 2)]
[im 1/17]
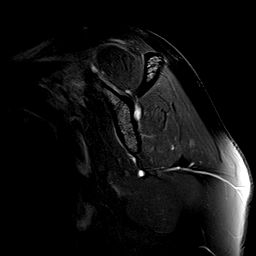
[im 3/17]
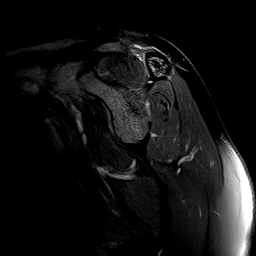
[im 10/17]
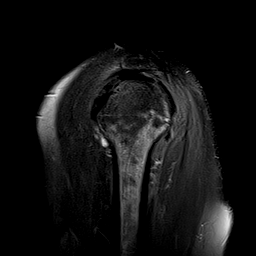
[im 14/17]
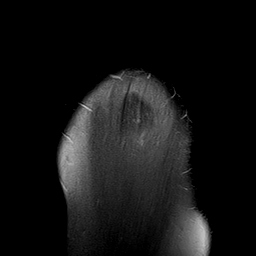

[28 of 40 positions shown; findings below may reference images not displayed]

FINDINGS: Rotator cuff: Intact. Mild appearing supraspinatus tendinopathy
noted.

Muscles:  Normal without atrophy or focal lesion.

Biceps long head:  Intact and normal appearance.

Acromioclavicular Joint: Normal. Type 1 acromion. No
subacromial/subdeltoid bursal fluid.

Glenohumeral Joint: Appears normal.

Labrum:  Intact.

Bones:  No fracture or focal lesion.

Other: None.
IMPRESSION: Mild appearing supraspinatus tendinopathy without tear. The exam is
otherwise negative.

## 2019-07-09 ENCOUNTER — Telehealth: Payer: Self-pay | Admitting: Orthopedic Surgery

## 2019-07-09 NOTE — Telephone Encounter (Signed)
Called patient left voicemail message to return call to schedule an appointment with Dr Marlou Sa for MRI review

## 2019-07-10 ENCOUNTER — Other Ambulatory Visit (HOSPITAL_COMMUNITY): Payer: Self-pay | Admitting: Otolaryngology

## 2019-07-10 ENCOUNTER — Other Ambulatory Visit: Payer: Self-pay | Admitting: Otolaryngology

## 2019-07-10 DIAGNOSIS — R519 Headache, unspecified: Secondary | ICD-10-CM

## 2019-07-16 ENCOUNTER — Telehealth: Payer: Self-pay | Admitting: Orthopedic Surgery

## 2019-07-16 ENCOUNTER — Ambulatory Visit (HOSPITAL_COMMUNITY)
Admission: RE | Admit: 2019-07-16 | Discharge: 2019-07-16 | Disposition: A | Discharge status: Home / Self Care | Payer: 59 | Source: Ambulatory Visit | Attending: Otolaryngology | Admitting: Otolaryngology

## 2019-07-16 ENCOUNTER — Other Ambulatory Visit: Payer: Self-pay

## 2019-07-16 DIAGNOSIS — R519 Headache, unspecified: Secondary | ICD-10-CM | POA: Insufficient documentation

## 2019-07-16 IMAGING — CT CT MAXILLOFACIAL W/O CM
3 series · 16 of 47 positions shown, 19 images · non-contrast
Comparison: None.

CLINICAL DATA: Chronic sinus headaches and sinus drainage

EXAM:
CT MAXILLOFACIAL WITHOUT CONTRAST
TECHNIQUE: Multidetector CT imaging of the maxillofacial structures was
performed. Multiplanar CT image reconstructions were also generated.

[Series 3: facialbone 2.0 (person_name) (person_name) · axial · 0.30mm/px · z∈[-214,-70]mm · 10 of 84 slices shown, 13 images]
[im 6/84  brain]
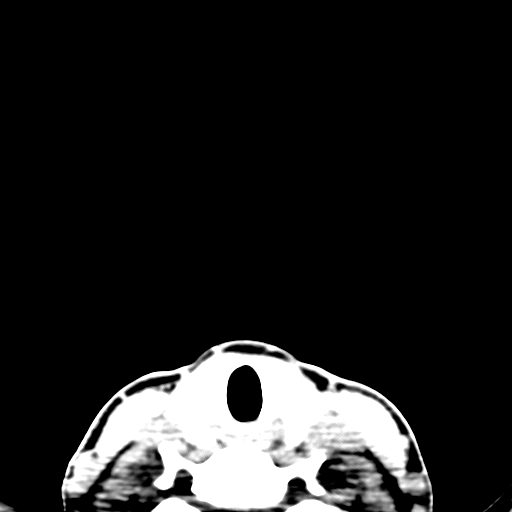
[im 6/84  bone]
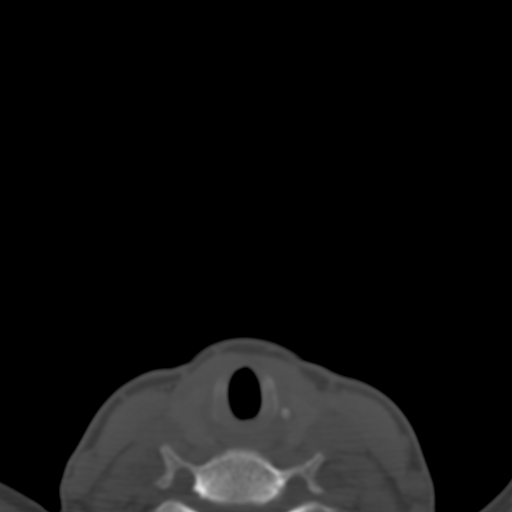
[im 15/84  bone]
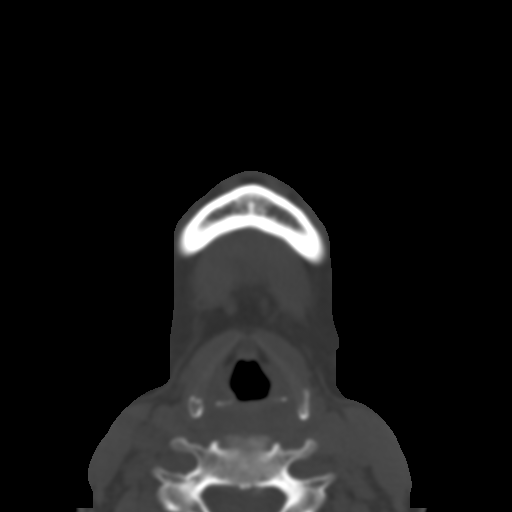
[im 23/84  bone]
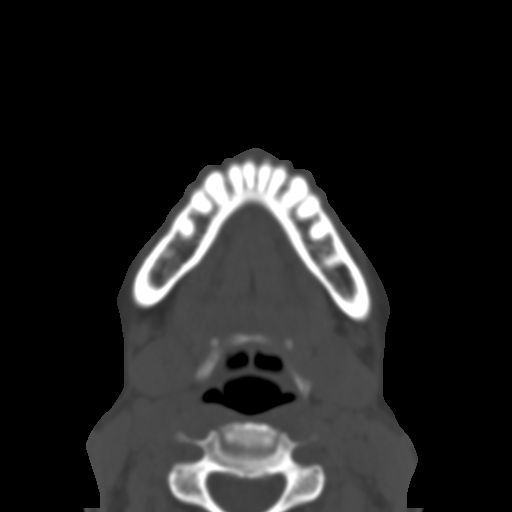
[im 29/84  bone]
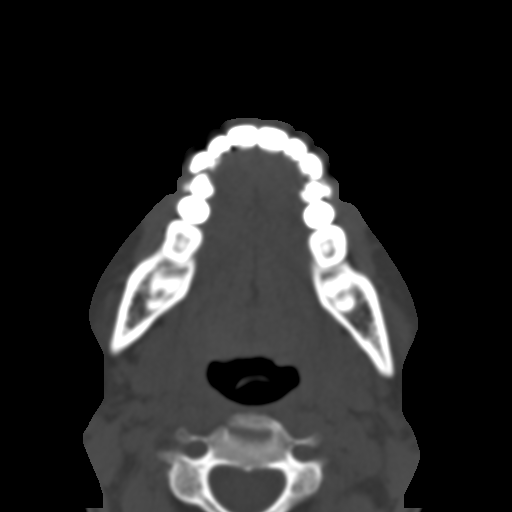
[im 38/84  brain]
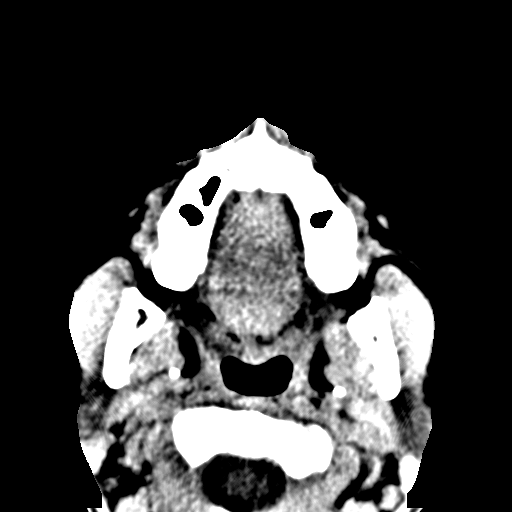
[im 38/84  bone]
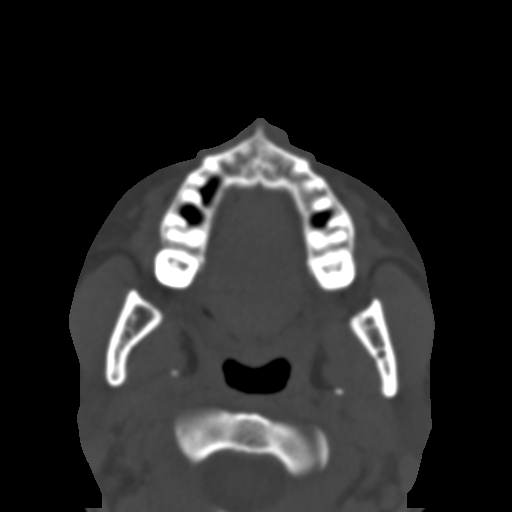
[im 46/84  bone]
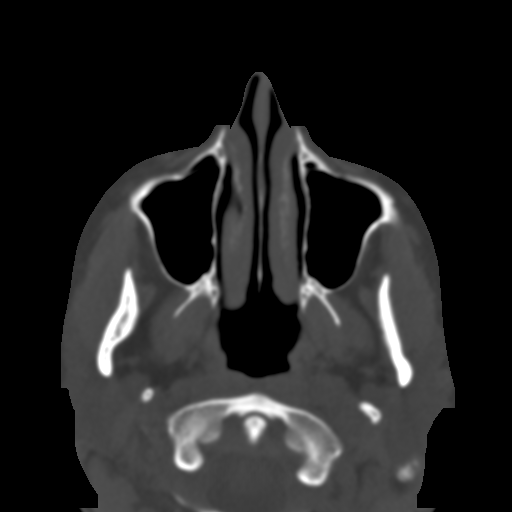
[im 55/84  bone]
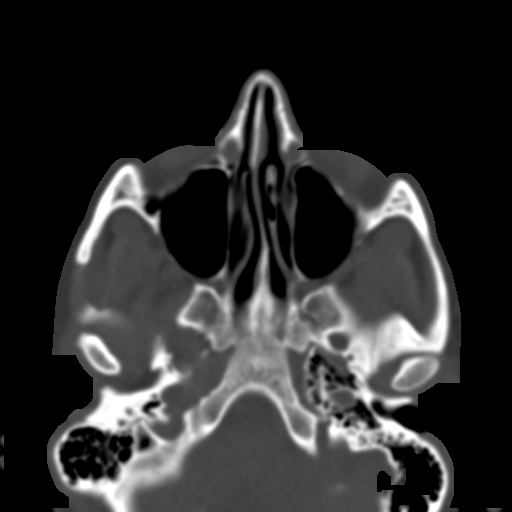
[im 63/84  bone]
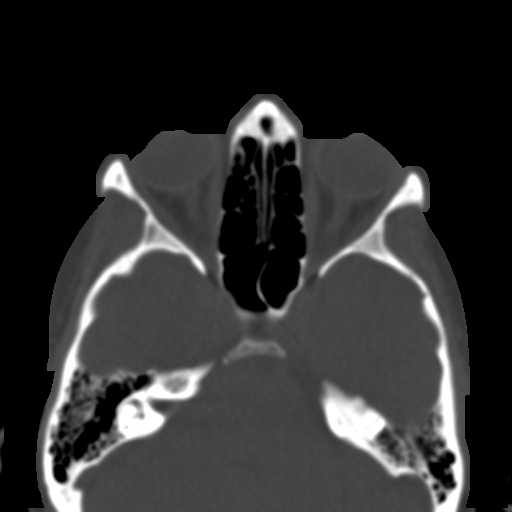
[im 69/84  brain]
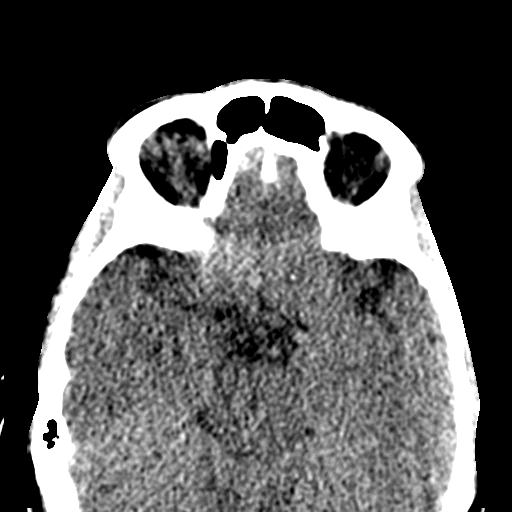
[im 69/84  bone]
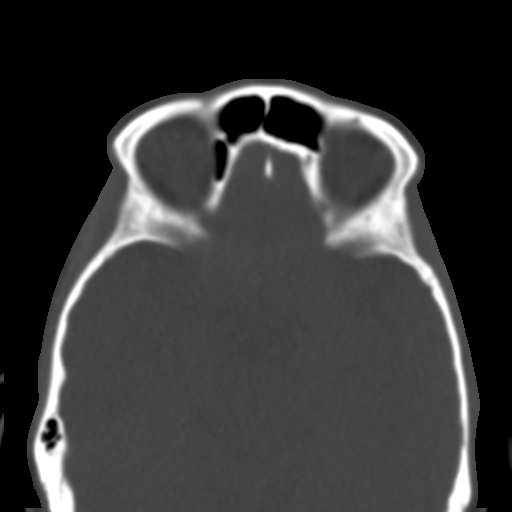
[im 78/84  bone]
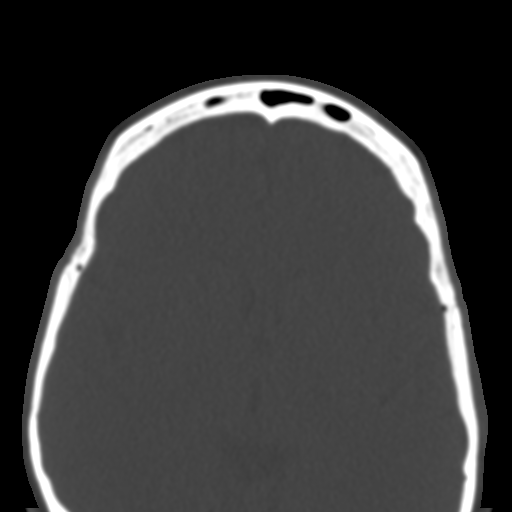

[Series 7: facialbone 2.0 cor st · coronal · 0.32mm/px · 3 of 76 slices shown]
[im 26/76  bone]
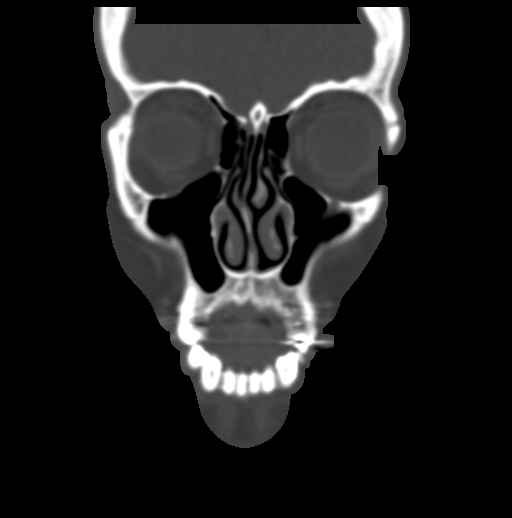
[im 34/76  bone]
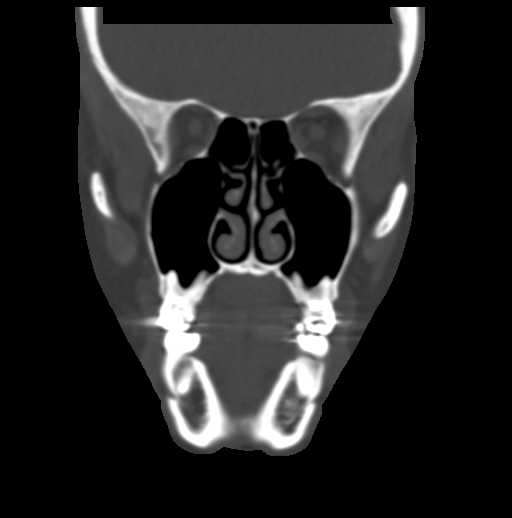
[im 42/76  bone]
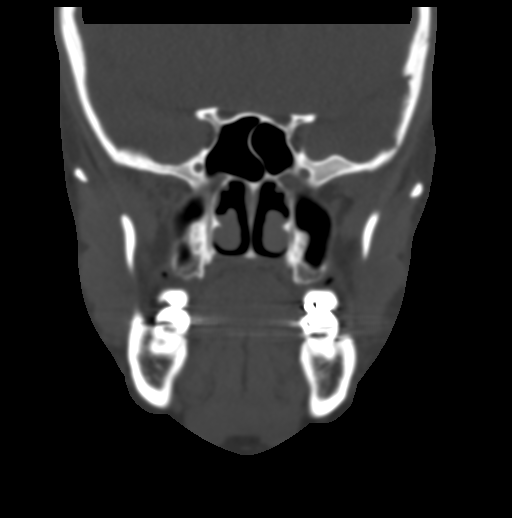

[Series 8: facialbone 2.0 sag st · sagittal · 0.32mm/px · 3 of 83 slices shown]
[im 28/83  bone]
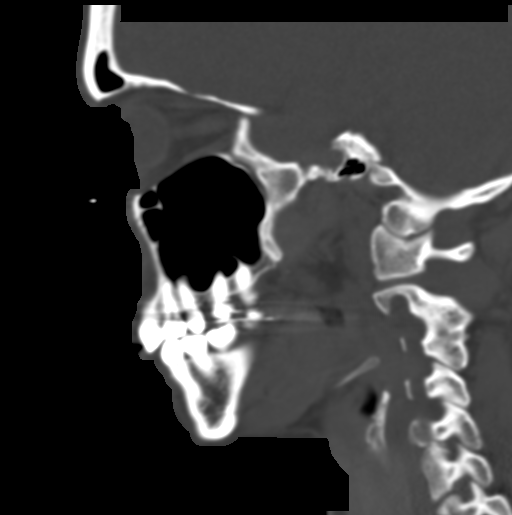
[im 42/83  bone]
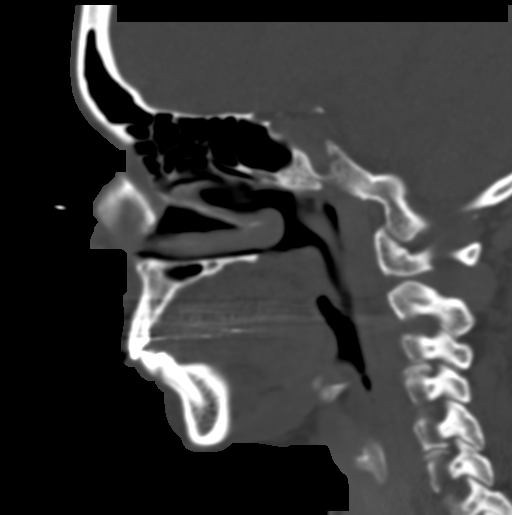
[im 55/83  bone]
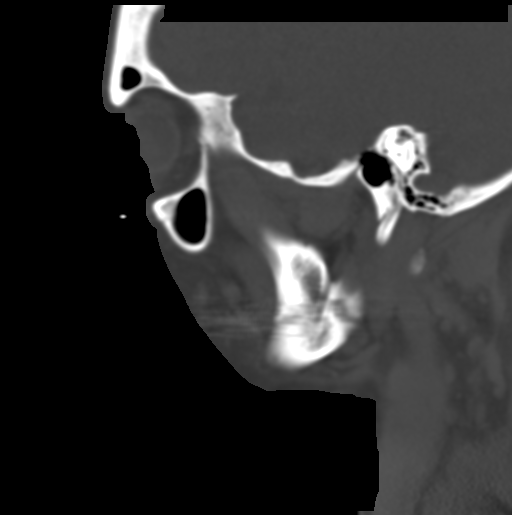

[16 of 47 positions shown; findings below may reference images not displayed]

FINDINGS: Paranasal sinuses:

Frontal: Normally aerated. Patent frontal sinus drainage pathways.

Ethmoid: Normally aerated.

Maxillary: Normally aerated.

Sphenoid: Normally aerated. Patent sphenoethmoidal recesses.

Right ostiomeatal unit: Patent.

Left ostiomeatal unit: Patent.

Nasal passages: Patent. Intact nasal septum is deviated from left to
right.

Other: Included facial and pharyngeal soft tissues are unremarkable.
Orbits are unremarkable. Limited intracranial imaging demonstrates
no acute abnormality. Temporomandibular joints are unremarkable.
Mastoid air cells and middle ears the are aerated.
IMPRESSION: Normally aerated paranasal sinuses.  Patent sinus drainage pathways.

## 2019-07-16 NOTE — Telephone Encounter (Signed)
Patient is scheduled to see Dr. Marlou Sa 07/22/2019 at 1:45pm

## 2019-07-22 ENCOUNTER — Ambulatory Visit (INDEPENDENT_AMBULATORY_CARE_PROVIDER_SITE_OTHER): Payer: 59 | Admitting: Orthopedic Surgery

## 2019-07-22 ENCOUNTER — Encounter: Payer: Self-pay | Admitting: Orthopedic Surgery

## 2019-07-22 ENCOUNTER — Other Ambulatory Visit: Payer: Self-pay

## 2019-07-22 DIAGNOSIS — M7502 Adhesive capsulitis of left shoulder: Secondary | ICD-10-CM

## 2019-07-22 DIAGNOSIS — Z1231 Encounter for screening mammogram for malignant neoplasm of breast: Secondary | ICD-10-CM | POA: Diagnosis not present

## 2019-07-23 MED ORDER — MELOXICAM 15 MG PO TABS: 15.0000 mg | ORAL_TABLET | Freq: Every day | ORAL | 1 refills | Status: DC

## 2019-07-23 MED FILL — MELOXICAM 15 MG TABLET: 15 | 30 days supply | Qty: 30 | Fill #0

## 2019-07-25 ENCOUNTER — Encounter: Payer: Self-pay | Admitting: Orthopedic Surgery

## 2019-07-25 MED FILL — FLUTICASONE PROP 50 MCG SPR: 50 | 30 days supply | Qty: 16 | Fill #1

## 2019-07-25 NOTE — Progress Notes (Signed)
Office Visit Note   Patient: Lisa West           Date of Birth: [birth year]           MRN: [id number] Visit Date: 07/22/2019 Requested by: Molli Posey, [place],  [place] [address] PCP: Molli Posey, MD  Subjective: Chief Complaint  Patient presents with  . Left Shoulder - Pain, Follow-up    HPI: Lisa West is a 49 y.o. female who presents to the office complaining of left shoulder pain.  Patient notes left shoulder pain since June 2020.  She returns to discuss MRI results.  She has had left shoulder pain since June 2020 that persisted until now.  She had severe pain until October and then she had hysterectomy in November.  Ever since a hysterectomy for pain has somewhat improved but persists to some degree and she has had worsening function since November.  She is unable to raise her arm in abduction greater than about 80 degrees.  She localizes the majority of her pain to the lateral aspect of the shoulder.  She has no history of shoulder surgery.  Denies any neck pain, numbness/tingling, radiculopathy.  She has no history of diabetes or thyroid issues.  She is not taking any anti-inflammatory medication at this time.  MRI of the left shoulder revealed no significant degenerative changes with mild supraspinatus tendinopathy without rotator cuff tear..                ROS:  All systems reviewed are negative as they relate to the chief complaint within the history of present illness.  Patient denies fevers or chills.  Assessment & Plan: Visit Diagnoses:  1. Adhesive capsulitis of left shoulder     Plan: Patient is a 49 year old female who presents complaining of left shoulder pain.  She has been having ongoing left shoulder pain since June 2020 and decreasing function of the shoulder since November 2020.  On exam she has decreased passive range of motion of the shoulder with decreased external rotation, abduction, forward flexion and internal  rotation compared with the contralateral side.  Impression is adhesive capsulitis of the left shoulder.  Discussed options available to patient.  Plan for left shoulder cortisone injection with home exercise program and daily meloxicam.  Reviewed exercises that would benefit patient in the clinic today.  Patient understands and will adhere to home exercise program.  Plan for clinical recheck in 6 weeks.  Patient agrees with plan.  Plan in 6 weeks is decision for or against repeat injection and also to assess clinically if she has regained any of her range of motion.  Surgery an option but that is still a little ways in the future based on her clinical response to exercises and glenohumeral joint injection  Follow-Up Instructions: No follow-ups on file.   Orders:  No orders of the defined types were placed in this encounter.  Meds ordered this encounter  Medications  . meloxicam (MOBIC) 15 MG tablet    Sig: Take 1 tablet (15 mg total) by mouth daily.    Dispense:  30 tablet    Refill:  1      Procedures: Large Joint Inj: L glenohumeral on 07/28/2019 1:08 PM Indications: diagnostic evaluation and pain Details: 18 G 1.5 in needle, posterior approach  Arthrogram: No  Medications: 9 mL bupivacaine 0.5 %; 40 mg methylPREDNISolone acetate 40 MG/ML; 5 mL lidocaine 1 % Outcome: tolerated well, no immediate complications  Procedure, treatment alternatives, risks and benefits explained, specific risks discussed. Consent was given by the patient. Immediately prior to procedure a time out was called to verify the correct patient, procedure, equipment, support staff and site/side marked as required. Patient was prepped and draped in the usual sterile fashion.       Clinical Data: No additional findings.  Objective: Vital Signs: There were no vitals taken for this visit.  Physical Exam:  Constitutional: Patient appears well-developed HEENT:  Head: Normocephalic Eyes:EOM are normal Neck:  Normal range of motion Cardiovascular: Normal rate Pulmonary/chest: Effort normal Neurologic: Patient is alert Skin: Skin is warm Psychiatric: Patient has normal mood and affect  Ortho Exam:  Left shoulder Exam Decreased passive range of motion in external rotation, abduction, forward flexion, internal rotation of the left shoulder compared with the right shoulder. No TTP over the [facility] joint or bicipital groove 5/5 motor strength of the subscapularis, supraspinatus, and infraspinatus muscles Negative Hawkins impingement 5/5 grip strength, forearm pronation/supination, and bicep strength  Specialty Comments:  No specialty comments available.  Imaging: No results found.   PMFS History: Patient Active Problem List   Diagnosis Date Noted  . Leiomyoma 05/07/2019  . Nevus, non-neoplastic 08/31/2011   Past Medical History:  Diagnosis Date  . Complication of anesthesia    hard to wake up  . History of kidney stones   . Kidney stone 03/2009    Family History  Problem Relation Age of Onset  . Kidney disease Maternal Grandmother        kidney stones that cause her to lose one of her kindeys   . Breast cancer Paternal Grandmother 95  . Colon cancer Neg Hx   . Esophageal cancer Neg Hx   . Rectal cancer Neg Hx   . Stomach cancer Neg Hx     Past Surgical History:  Procedure Laterality Date  . ABDOMINAL HYSTERECTOMY Bilateral 05/07/2019   Procedure: HYSTERECTOMY ABDOMINAL with salpingectomy;  Surgeon: Molli Posey, MD;  [facility];  Service: Gynecology;  Laterality: Bilateral;  need bed  . CESAREAN SECTION  2002  . CYSTOSCOPY  2010  . DILATION AND CURETTAGE OF UTERUS    . DILATION AND EVACUATION     X 2  . TONSILLECTOMY  1982   Social History   Occupational History  . Occupation: Immunologist  Tobacco Use  . Smoking status: Never Smoker  . Smokeless tobacco: Never Used  Substance and Sexual Activity  . Alcohol use: Yes    Alcohol/week: 4.0 standard  drinks    Types: 4 Glasses of wine per week  . Drug use: Never  . Sexual activity: Not on file

## 2019-07-28 ENCOUNTER — Encounter: Payer: Self-pay | Admitting: Orthopedic Surgery

## 2019-07-28 DIAGNOSIS — M7502 Adhesive capsulitis of left shoulder: Secondary | ICD-10-CM | POA: Diagnosis not present

## 2019-07-28 MED ORDER — METHYLPREDNISOLONE ACETATE 40 MG/ML IJ SUSP
40.0000 mg | INTRAMUSCULAR | Status: AC | PRN
  Administered 2019-07-28: 40 mg via INTRA_ARTICULAR

## 2019-07-28 MED ORDER — BUPIVACAINE HCL 0.5 % IJ SOLN
9.0000 mL | INTRAMUSCULAR | Status: AC | PRN
  Administered 2019-07-28: 9 mL via INTRA_ARTICULAR

## 2019-07-28 MED ORDER — LIDOCAINE HCL 1 % IJ SOLN
5.0000 mL | INTRAMUSCULAR | Status: AC | PRN
  Administered 2019-07-28: 5 mL

## 2019-08-02 DIAGNOSIS — J310 Chronic rhinitis: Secondary | ICD-10-CM | POA: Diagnosis not present

## 2019-08-02 DIAGNOSIS — J342 Deviated nasal septum: Secondary | ICD-10-CM | POA: Diagnosis not present

## 2019-08-02 DIAGNOSIS — J343 Hypertrophy of nasal turbinates: Secondary | ICD-10-CM | POA: Diagnosis not present

## 2019-08-02 DIAGNOSIS — R519 Headache, unspecified: Secondary | ICD-10-CM | POA: Diagnosis not present

## 2019-08-02 DIAGNOSIS — H6983 Other specified disorders of Eustachian tube, bilateral: Secondary | ICD-10-CM | POA: Diagnosis not present

## 2019-08-08 DIAGNOSIS — Z6824 Body mass index (BMI) 24.0-24.9, adult: Secondary | ICD-10-CM | POA: Diagnosis not present

## 2019-08-08 DIAGNOSIS — N951 Menopausal and female climacteric states: Secondary | ICD-10-CM | POA: Diagnosis not present

## 2019-08-08 DIAGNOSIS — Z01419 Encounter for gynecological examination (general) (routine) without abnormal findings: Secondary | ICD-10-CM | POA: Diagnosis not present

## 2019-09-05 ENCOUNTER — Encounter: Payer: Self-pay | Admitting: Orthopedic Surgery

## 2019-09-05 ENCOUNTER — Ambulatory Visit (INDEPENDENT_AMBULATORY_CARE_PROVIDER_SITE_OTHER): Payer: 59 | Admitting: Orthopedic Surgery

## 2019-09-05 ENCOUNTER — Other Ambulatory Visit: Payer: Self-pay

## 2019-09-05 DIAGNOSIS — M7502 Adhesive capsulitis of left shoulder: Secondary | ICD-10-CM

## 2019-09-06 ENCOUNTER — Encounter: Payer: Self-pay | Admitting: Orthopedic Surgery

## 2019-09-06 NOTE — Progress Notes (Signed)
Office Visit Note   Patient: Lisa West           Date of Birth: 11-21-1970           MRN: [id number] Visit Date: 09/05/2019 Requested by: Molli Posey, [address] [place],  [place] [id number] PCP: Molli Posey, MD  Subjective: Chief Complaint  Patient presents with  . Left Shoulder - Pain    HPI: Lisa West is a 49 y.o. female who presents to the office complaining of left shoulder pain.  She is following up status post left shoulder injection on 07/22/2019 for adhesive capsulitis.  She notes that she is doing much better with increased range of motion.  She is now able to bring her arm fully above her head whereas before she could not.  She denies any weakness, neck pain.  She is taking Mobic occasionally with some relief.  Overall she has very little pain with only occasional pain when she externally rotates or internally rotates her shoulder.  She has been doing a home exercise program as directed at her last office visit..                ROS:  All systems reviewed are negative as they relate to the chief complaint within the history of present illness.  Patient denies fevers or chills.  Assessment & Plan: Visit Diagnoses:  1. Adhesive capsulitis of left shoulder     Plan: Patient is a 49 year old female who presents for reevaluation of adhesive capsulitis of the left shoulder.  She is status post left shoulder injection on 07/22/2019.  She is doing very well following that.  She has been doing a home exercise program.  On exam she has excellent improvement of forward flexion and abduction with only a very subtle loss of abduction compared with the other side.  She is still lacking some external rotation and internal rotation but overall has progressed significantly.  Plan for patient to continue home exercise program.  No need for subsequent shoulder injection or manipulation at this time.  Follow-up with the office as needed.  Follow-Up  Instructions: No follow-ups on file.   Orders:  No orders of the defined types were placed in this encounter.  No orders of the defined types were placed in this encounter.     Procedures: No procedures performed   Clinical Data: No additional findings.  Objective: Vital Signs: There were no vitals taken for this visit.  Physical Exam:  Constitutional: Patient appears well-developed HEENT:  Head: Normocephalic Eyes:EOM are normal Neck: Normal range of motion Cardiovascular: Normal rate Pulmonary/chest: Effort normal Neurologic: Patient is alert Skin: Skin is warm Psychiatric: Patient has normal mood and affect  Ortho Exam:  Left shoulder Exam Full forward flexion range of motion compared with contralateral side.  Subtle about 5 degree loss of abduction compared with contralateral side.  Decrease external rotation and internal rotation moderately compared with the right shoulder. Good subscapularis, supraspinatus, and infraspinatus strength Negative Hawkins impingement 5/5 grip strength, forearm pronation/supination, and bicep strength  Specialty Comments:  No specialty comments available.  Imaging: No results found.   PMFS History: Patient Active Problem List   Diagnosis Date Noted  . Leiomyoma 05/07/2019  . Nevus, non-neoplastic 08/31/2011   Past Medical History:  Diagnosis Date  . Complication of anesthesia    hard to wake up  . History of kidney stones   . Kidney stone 03/2009    Family History  Problem  Relation Age of Onset  . Kidney disease Maternal Grandmother        kidney stones that cause her to lose one of her kindeys   . Breast cancer Paternal Grandmother 95  . Colon cancer Neg Hx   . Esophageal cancer Neg Hx   . Rectal cancer Neg Hx   . Stomach cancer Neg Hx     Past Surgical History:  Procedure Laterality Date  . ABDOMINAL HYSTERECTOMY Bilateral 05/07/2019   Procedure: HYSTERECTOMY ABDOMINAL with salpingectomy;  Surgeon: Molli Posey, MD;  Location: [facility];  Service: Gynecology;  Laterality: Bilateral;  need bed  . CESAREAN SECTION  2002  . CYSTOSCOPY  2010  . DILATION AND CURETTAGE OF UTERUS    . DILATION AND EVACUATION     X 2  . TONSILLECTOMY  1982   Social History   Occupational History  . Occupation: Immunologist  Tobacco Use  . Smoking status: Never Smoker  . Smokeless tobacco: Never Used  Substance and Sexual Activity  . Alcohol use: Yes    Alcohol/week: 4.0 standard drinks    Types: 4 Glasses of wine per week  . Drug use: Never  . Sexual activity: Not on file

## 2020-03-17 DIAGNOSIS — Z0100 Encounter for examination of eyes and vision without abnormal findings: Secondary | ICD-10-CM | POA: Diagnosis not present

## 2020-06-23 DIAGNOSIS — Z0000 Encounter for general adult medical examination without abnormal findings: Secondary | ICD-10-CM | POA: Diagnosis not present

## 2020-06-23 DIAGNOSIS — N390 Urinary tract infection, site not specified: Secondary | ICD-10-CM | POA: Diagnosis not present

## 2020-07-02 ENCOUNTER — Other Ambulatory Visit (HOSPITAL_COMMUNITY): Payer: Self-pay | Admitting: Internal Medicine

## 2020-07-02 DIAGNOSIS — Z23 Encounter for immunization: Secondary | ICD-10-CM | POA: Diagnosis not present

## 2020-07-02 DIAGNOSIS — G43909 Migraine, unspecified, not intractable, without status migrainosus: Secondary | ICD-10-CM | POA: Diagnosis not present

## 2020-07-02 DIAGNOSIS — Z0000 Encounter for general adult medical examination without abnormal findings: Secondary | ICD-10-CM | POA: Diagnosis not present

## 2020-07-02 DIAGNOSIS — R0982 Postnasal drip: Secondary | ICD-10-CM | POA: Diagnosis not present

## 2020-07-02 DIAGNOSIS — R002 Palpitations: Secondary | ICD-10-CM | POA: Diagnosis not present

## 2020-07-02 DIAGNOSIS — R3129 Other microscopic hematuria: Secondary | ICD-10-CM | POA: Diagnosis not present

## 2020-07-02 DIAGNOSIS — J309 Allergic rhinitis, unspecified: Secondary | ICD-10-CM | POA: Diagnosis not present

## 2020-07-02 MED FILL — IPRATROPIUM 0.06% SPRAY: 0.06 | 14 days supply | Qty: 15 | Fill #0

## 2020-07-02 MED FILL — UBRELVY 100 MG TABS: 100 | 30 days supply | Qty: 10 | Fill #0

## 2020-07-06 ENCOUNTER — Other Ambulatory Visit (HOSPITAL_COMMUNITY): Payer: Self-pay | Admitting: Internal Medicine

## 2020-07-07 DIAGNOSIS — G43909 Migraine, unspecified, not intractable, without status migrainosus: Secondary | ICD-10-CM | POA: Diagnosis not present

## 2020-07-07 DIAGNOSIS — R0982 Postnasal drip: Secondary | ICD-10-CM | POA: Diagnosis not present

## 2020-07-08 DIAGNOSIS — Z1212 Encounter for screening for malignant neoplasm of rectum: Secondary | ICD-10-CM | POA: Diagnosis not present

## 2020-07-08 DIAGNOSIS — Z1211 Encounter for screening for malignant neoplasm of colon: Secondary | ICD-10-CM | POA: Diagnosis not present

## 2020-07-20 LAB — COLOGUARD: COLOGUARD: NEGATIVE

## 2020-08-05 MED FILL — UBRELVY 100 MG TABS: 100 | 30 days supply | Qty: 10 | Fill #0

## 2020-08-11 ENCOUNTER — Other Ambulatory Visit (HOSPITAL_COMMUNITY): Payer: Self-pay | Admitting: Otolaryngology

## 2020-08-11 DIAGNOSIS — H6983 Other specified disorders of Eustachian tube, bilateral: Secondary | ICD-10-CM | POA: Diagnosis not present

## 2020-08-11 DIAGNOSIS — J342 Deviated nasal septum: Secondary | ICD-10-CM | POA: Diagnosis not present

## 2020-08-11 DIAGNOSIS — R519 Headache, unspecified: Secondary | ICD-10-CM | POA: Diagnosis not present

## 2020-08-11 MED FILL — FLUTICASONE PROP 50 MCG SPR: 50 | 30 days supply | Qty: 16 | Fill #0

## 2020-08-21 MED FILL — FLUTICASONE PROP 50 MCG SPR: 50 | 30 days supply | Qty: 16 | Fill #0

## 2020-09-03 ENCOUNTER — Encounter: Payer: Self-pay | Admitting: *Deleted

## 2020-09-03 ENCOUNTER — Other Ambulatory Visit: Payer: Self-pay | Admitting: *Deleted

## 2020-09-07 ENCOUNTER — Encounter: Payer: Self-pay | Admitting: Neurology

## 2020-09-07 ENCOUNTER — Ambulatory Visit (INDEPENDENT_AMBULATORY_CARE_PROVIDER_SITE_OTHER): Payer: 59 | Admitting: Neurology

## 2020-09-07 ENCOUNTER — Other Ambulatory Visit: Payer: Self-pay

## 2020-09-07 VITALS — BP 124/71 | HR 69 | Ht 62.0 in | Wt 123.3 lb

## 2020-09-07 DIAGNOSIS — R519 Headache, unspecified: Secondary | ICD-10-CM

## 2020-09-07 DIAGNOSIS — G479 Sleep disorder, unspecified: Secondary | ICD-10-CM

## 2020-09-07 DIAGNOSIS — G43009 Migraine without aura, not intractable, without status migrainosus: Secondary | ICD-10-CM | POA: Diagnosis not present

## 2020-09-07 NOTE — Patient Instructions (Signed)
It was nice to meet you today.  It is possible that you have a form of migraine headaches.  It does not sound like you have clear-cut triggers but sleep deprivation may be a trigger.  Please continue to hydrate well and try to achieve at least 7 hours of sleep, if not 8 on a consistent basis.  Limit your caffeine and alcohol use as you are.  You can continue to utilize [place] as a as needed medication for acute attacks, thankfully, your headache frequency has reduced.  We will go ahead with a brain MRI to rule out a structural cause of his symptoms.  I would also like to screen you for sleep apnea with a home sleep test.  Sleep apnea can cause nocturnal and morning headaches.  We may consider a preventative medication if the need arises.  For now, you can continue with your as needed Ubrelvy through Dr. Shelia Media.

## 2020-09-07 NOTE — Progress Notes (Signed)
Subjective:    Patient ID: Lisa West is a 50 y.o. female.  HPI     Star Age, MD, PhD [facility] [address] [place], [place] [address]  Dear Dr. Shelia Media,   I saw your patient, Lisa West, upon your kind request in my neurologic clinic today for initial consultation of her recurrent headaches.  Patient is unaccompanied today.  As you know, Lisa West is a 50 year old with a benign medical history, who reports recurrent headaches for the past 2 years, first time she had a headache was in January 2020.  She had a preceding illness as far she recalls which was in September 2019, this was a severe chest cold and she was on Mucinex for several weeks.  Headaches were more frequent in the past, occurring about once every 2 to 3 weeks.  She describes the headache as a throbbing and stabbing sensation which wakes her up in the middle of the night, typically around 2 AM.  Headache lasts for about 12 hours.  It starts in the center of her forehead typically.  She thought it was sinus related and sought consultation with ENT.  She does have nasal congestion and postnasal drip as well as muffled hearing.  She tried Zyrtec but it exacerbated the headaches.  She is now on nasal spray, Flonase.   She does not have a prior history of migraines.  She does not have aura.  She has had her eyes examined, she goes every year.  She has prescription eyeglasses/contact lenses. She denies any sudden onset of one-sided weakness or numbness or tingling or droopy face or slurring of speech.  She does not typically have any nausea or vomiting except for once when she had nausea.  I reviewed your office note from 07/02/2020.  She was started on Ubrelvy as needed.  She had an interim consultation with Dr. Jerrell Belfast in ENT on 08/11/2020 and was found to have deviated septum, inferior turbinate hypertrophy and eustachian tube dysfunction but no acute sinusitis.  She he had  had a maxillofacial CT without contrast on 07/16/2019 and I reviewed the results: IMPRESSION: Normally aerated paranasal sinuses.  Patent sinus drainage pathways.  She had blood work through your office on 06/23/2020 and I reviewed the results: Lipid panel was benign, total cholesterol 131, triglycerides 32, HDL 70, LDL 52.  CMP showed BUN of 12, creatinine of 0.63, and benign results otherwise.  CBC with differential was benign, WBC 6.4, RBC 4.28, hemoglobin 12.8, hematocrit 39.2.  She drinks caffeine in limitation, 1 serving per day, she drinks alcohol very infrequently, has not had any in months.  She is a non-smoker.  She is married and lives with her husband.  She has 3 grown children, 2 daughters, 1 son, 1 daughter with migraines.  Patient works as a Music therapist for [employer].  She has not found any telltale triggers.  She has not had any increased amount of stress, she hydrates well.  She does not always achieve a whole lot of sleep, averages maybe 6 hours.  She goes to bed between 10 and 11 and rise time is around 5:30 AM.  Her Past Medical History Is Significant For: Past Medical History:  Diagnosis Date  . Complication of anesthesia    hard to wake up  . History of kidney stones   . Kidney stone 03/2009  . Migraine     Her Past Surgical History Is Significant For: Past Surgical History:  Procedure Laterality Date  . ABDOMINAL HYSTERECTOMY Bilateral 05/07/2019   Procedure: HYSTERECTOMY ABDOMINAL with salpingectomy;  Surgeon: Molli Posey, MD;  Location: [facility];  Service: Gynecology;  Laterality: Bilateral;  need bed  . CESAREAN SECTION  2002  . CYSTOSCOPY  2010   w/stent  . DILATION AND CURETTAGE OF UTERUS    . DILATION AND EVACUATION     X 2  . TONSILLECTOMY  1982    Her Family History Is Significant For: Family History  Problem Relation Age of Onset  . Kidney disease Maternal Grandmother        kidney stones that cause her to lose one of her  kindeys   . Breast cancer Paternal Grandmother 95  . Colon cancer Neg Hx   . Esophageal cancer Neg Hx   . Rectal cancer Neg Hx   . Stomach cancer Neg Hx     Her Social History Is Significant For: Social History   Socioeconomic History  . Marital status: Married    Spouse name: Not on file  . Number of children: 3  . Years of education: Not on file  . Highest education level: Not on file  Occupational History  . Occupation: Immunologist  Tobacco Use  . Smoking status: Never Smoker  . Smokeless tobacco: Never Used  Vaping Use  . Vaping Use: Never used  Substance and Sexual Activity  . Alcohol use: Yes    Alcohol/week: 4.0 standard drinks    Types: 4 Glasses of wine per week  . Drug use: Never  . Sexual activity: Not on file  Other Topics Concern  . Not on file  Social History Narrative   Lives with husband   Social Determinants of Health   Financial Resource Strain: Not on file  Food Insecurity: Not on file  Transportation Needs: Not on file  Physical Activity: Not on file  Stress: Not on file  Social Connections: Not on file    Her Allergies Are:  No Known Allergies:   Her Current Medications Are:  Outpatient Encounter Medications as of 09/07/2020  Medication Sig  . fluticasone (FLONASE) 50 MCG/ACT nasal spray SMARTSIG:2 Spray(s) Both Nares Every Night  . UBRELVY 100 MG TABS   . [DISCONTINUED] ibuprofen (ADVIL) 800 MG tablet Take 1 tablet (800 mg total) by mouth every 8 (eight) hours as needed.  . [DISCONTINUED] meloxicam (MOBIC) 15 MG tablet Take 1 tablet (15 mg total) by mouth daily.  . [DISCONTINUED] omeprazole (PRILOSEC) 40 MG capsule Take 1 capsule (40 mg total) by mouth 2 (two) times daily. (Patient not taking: Reported on 05/05/2019)  . [DISCONTINUED] oxyCODONE-acetaminophen (PERCOCET/ROXICET) 5-325 MG tablet Take 1-2 tablets by mouth every 4 (four) hours as needed for moderate pain.   No facility-administered encounter medications on file as of 09/07/2020.   :   Review of Systems:  Out of a complete 14 point review of systems, all are reviewed and negative with the exception of these symptoms as listed below:  Review of Systems  Neurological:       Jan of 2020 every 3-4 weeks. Pt reports migraine is located in the middle of her forehead. Pt reports 5-6 weeks in May of 2021. Pt reports associated nausea. Reports sx will start between 3-4 am and resolve by noon. Noticed zyretec exacerbates. Drenda Freeze has been prescribed, helps when sx are present.  OTC meds did not help.     Objective:  Neurological Exam  Physical Exam Physical Examination:   Vitals:   09/07/20  0750  BP: 124/71  Pulse: 69   General Examination: The patient is a very pleasant 50 y.o. female in no acute distress. She appears well-developed and well-nourished and well groomed.   HEENT: Normocephalic, atraumatic, pupils are equal, round and reactive to light and accommodation.  Contact lenses in place.  Funduscopic exam is normal with sharp disc margins noted. Extraocular tracking is good without limitation to gaze excursion or nystagmus noted. Normal smooth pursuit is noted. Hearing is grossly intact (tuning fork). Face is symmetric with normal facial animation and normal facial sensation. Speech is clear with no dysarthria noted. There is no hypophonia. There is no lip, neck/head, jaw or voice tremor. Neck is supple with full range of passive and active motion. There are no carotid bruits on auscultation. Oropharynx exam reveals: mild mouth dryness, good dental hygiene and no signif. airway crowding. Tonsils absent. Tongue protrudes centrally and palate elevates symmetrically.   Chest: Clear to auscultation without wheezing, rhonchi or crackles noted.  Heart: S1+S2+0, regular and normal without murmurs, rubs or gallops noted.   Abdomen: Soft, non-tender and non-distended with normal bowel sounds appreciated on auscultation.  Extremities: There is no pitting edema in the  distal lower extremities bilaterally. Pedal pulses are intact.  Skin: Warm and dry without trophic changes noted. There are no varicose veins in the distal lower extremities.  Musculoskeletal: exam reveals no obvious joint deformities, tenderness or joint swelling or erythema.   Neurologically:  Mental status: The patient is awake, alert and oriented in all 4 spheres. Her immediate and remote memory, attention, language skills and fund of knowledge are appropriate. There is no evidence of aphasia, agnosia, apraxia or anomia. Speech is clear with normal prosody and enunciation. Thought process is linear. Mood is normal and affect is normal.  Cranial nerves II - XII are as described above under HEENT exam. In addition: shoulder shrug is normal with equal shoulder height noted. Motor exam: Normal bulk, strength and tone is noted. There is no drift, tremor or rebound. Romberg is negative. Reflexes are 2+ throughout. Babinski: Toes are flexor bilaterally. Fine motor skills and coordination: intact with normal finger taps, normal hand movements, normal rapid alternating patting, normal foot taps and normal foot agility.  Cerebellar testing: No dysmetria or intention tremor on finger to nose testing. Heel to shin is unremarkable bilaterally. There is no truncal or gait ataxia.  Sensory exam: intact to light touch, vibration, temperature sense in the upper and lower extremities.  Gait, station and balance: She stands easily. No veering to one side is noted. No leaning to one side is noted. Posture is age-appropriate and stance is narrow based. Gait shows normal stride length and normal pace. No problems turning are noted. Tandem walk is unremarkable.   Assessment and plan:   Assessment and Plan:  In summary, YASMEN CORTNER is a very pleasant 50 y.o.-year old female with a benign medical history, who presents for evaluation of her recurrent headaches of approximately 2+ years' duration.  Headache description  is pointing towards more multifactorial nature but it is not impossible that she has developed migraine headaches.  She does report history of postnasal drip and nasal congestion and taking Zyrtec made her headache worse.  She has been evaluated by ENT for chronic or acute sinusitis and did not have any issues although she does have eustachian tube dysfunction.  She has experienced primarily nocturnal headaches in the frequency has gone down in the past few months.  Neurological exam is nonfocal.  Nevertheless, due to the absence of prior migraine headaches, I would like to proceed with a brain MRI with and without contrast to rule out a structural cause of her symptoms.  We also talked about nocturnal headaches and other etiologies.  While she does not have telltale symptoms of underlying obstructive sleep apnea, I would like to proceed with at least a home sleep test to screen her for underlying sleep apnea.  She is agreeable to this.  She has had an updated eye examination and typically goes once a year.  She is advised to continue with the as needed use of Ubrelvy.  She is in agreement that we do not currently have a pressing need to consider a preventative medication but we certainly can if the need arises.  She is advised to follow-up routinely in this office in 6 months and we will keep her posted as to her test results regarding her brain MRI and her home sleep test by phone call.  I answered all her questions today and she was in agreement with the plan.  Thank you very much for allowing me to participate in the care of this nice patient. If I can be of any further assistance to you please do not hesitate to call me at [phone].  Sincerely,   Star Age, MD, PhD

## 2020-09-08 ENCOUNTER — Telehealth: Payer: Self-pay | Admitting: Neurology

## 2020-09-08 NOTE — Telephone Encounter (Signed)
MRI Brain w/wo Contrast 45 mins no to covid questions Dr. Rexene Alberts scheduled at [facility] 09/15/20 [facility] ref #[id number]

## 2020-09-15 ENCOUNTER — Other Ambulatory Visit: Payer: Self-pay

## 2020-09-15 ENCOUNTER — Ambulatory Visit: Payer: 59

## 2020-09-15 DIAGNOSIS — G479 Sleep disorder, unspecified: Secondary | ICD-10-CM | POA: Diagnosis not present

## 2020-09-15 DIAGNOSIS — G43009 Migraine without aura, not intractable, without status migrainosus: Secondary | ICD-10-CM | POA: Diagnosis not present

## 2020-09-15 DIAGNOSIS — R519 Headache, unspecified: Secondary | ICD-10-CM

## 2020-09-15 MED ORDER — GADOBENATE DIMEGLUMINE 529 MG/ML IV SOLN
15.0000 mL | Freq: Once | INTRAVENOUS | Status: AC | PRN
  Administered 2020-09-15: 15 mL via INTRAVENOUS

## 2020-09-16 ENCOUNTER — Telehealth: Payer: Self-pay

## 2020-09-16 NOTE — Progress Notes (Signed)
Please call and advise the patient that the recent scan we did was within normal limits. We did a brain MRI wo and with contrast, which showed normal and age-appropriate findings. In particular, there were no acute findings, such as a stroke, or mass or blood products or abnormal contrast uptake.   Star Age, MD, PhD

## 2020-09-16 NOTE — Telephone Encounter (Signed)
-----   Message from Star Age, MD sent at 09/16/2020  8:42 AM EDT ----- Please call and advise the patient that the recent scan we did was within normal limits. We did a brain MRI wo and with contrast, which showed normal and age-appropriate findings. In particular, there were no acute findings, such as a stroke, or mass or blood products or abnormal contrast uptake.   Star Age, MD, PhD

## 2020-09-16 NOTE — Telephone Encounter (Signed)
Pt advised of MRI results and verbalized understanding. She will keep Sept f/u as scheduled.

## 2020-09-24 DIAGNOSIS — Z1231 Encounter for screening mammogram for malignant neoplasm of breast: Secondary | ICD-10-CM | POA: Diagnosis not present

## 2020-09-24 DIAGNOSIS — Z01419 Encounter for gynecological examination (general) (routine) without abnormal findings: Secondary | ICD-10-CM | POA: Diagnosis not present

## 2020-09-24 DIAGNOSIS — Z6822 Body mass index (BMI) 22.0-22.9, adult: Secondary | ICD-10-CM | POA: Diagnosis not present

## 2020-09-30 ENCOUNTER — Ambulatory Visit (INDEPENDENT_AMBULATORY_CARE_PROVIDER_SITE_OTHER): Payer: 59 | Admitting: Neurology

## 2020-09-30 DIAGNOSIS — G4733 Obstructive sleep apnea (adult) (pediatric): Secondary | ICD-10-CM

## 2020-09-30 DIAGNOSIS — R519 Headache, unspecified: Secondary | ICD-10-CM

## 2020-09-30 DIAGNOSIS — G43009 Migraine without aura, not intractable, without status migrainosus: Secondary | ICD-10-CM

## 2020-09-30 DIAGNOSIS — G479 Sleep disorder, unspecified: Secondary | ICD-10-CM

## 2020-09-30 DIAGNOSIS — Z23 Encounter for immunization: Secondary | ICD-10-CM | POA: Diagnosis not present

## 2020-10-01 NOTE — Patient Instructions (Signed)
   [facility]  HOME SLEEP TEST (Watch PAT)  STUDY DATE: 09/30/2020  DOB: Feb 25, 1971  MRN: [id number]  ORDERING CLINICIAN: Star Age, MD, PhD   REFERRING CLINICIAN: Deland Pretty, MD   CLINICAL INFORMATION/HISTORY: As you know, Lisa West is a 50 year old with a benign medical history, who reports recurrent headaches for the past 2 years, first time she had a headache was in January 2020.  She had a preceding illness as far she recalls which was in September 2019, this was a severe chest cold and she was on Mucinex for several weeks.  Headaches were more frequent in the past, occurring about once every 2 to 3 weeks.    Epworth sleepiness score: NA  BMI: 22.7 kg/m  FINDINGS:   Total Record Time (hours, min):8hrs 86mins  Total Sleep Time (hours, min): 7hrs 38mins   Percent REM (%): 27.4   Calculated pAHI (per hour):  17.6      REM pAHI:    23.9  NREM pAHI: 15.4   Oxygen Saturation (%) Mean: 94 Minimum oxygen saturation (%):       91   O2 Saturation Range (%): 91 - 98  O2Saturation (minutes) <=88%: 0.0  Pulse Mean (bpm):  56 Pulse Range (44- 93)   IMPRESSION: OSA (obstructive sleep apnea) *Primary snoring    RECOMMENDATION:     INTERPRETING PHYSICIAN:    Star Age, MD, PhD  Board Certified in Neurology and Sleep Medicine  [facility] [address], [place], [place] [address] [phone]

## 2020-10-02 NOTE — Procedures (Signed)
   [facility] (Watch PAT)  STUDY DATE: 09/30/20  DOB: March 17, 1971  MRN: [id number]  ORDERING CLINICIAN: Star Age, MD, PhD   REFERRING CLINICIAN: Deland Pretty, MD   CLINICAL INFORMATION/HISTORY: 50 year old with a benign medical history, who reports recurrent headaches for the past 2 years.   Epworth sleepiness score: N/A  BMI: 22.7 kg/m  Neck Circumference: N/A  FINDINGS:   Total Record Time (hours, min): 8 H 2 min  Total Sleep Time (hours, min):  7 H 24 min   Percent REM (%):    27.4 %   Calculated pAHI (per hour): 17.6      REM pAHI: 23.9    NREM pAHI: 15.4 Supine AHI: 16.4   Oxygen Saturation (%) Mean: 94  Minimum oxygen saturation (%):        91   O2 Saturation Range (%): 91-98  O2Saturation (minutes) <=88%: 0 min   Pulse Mean (bpm):    56  Pulse Range (44-93)   IMPRESSION: OSA (obstructive sleep apnea)   RECOMMENDATION:  This home sleep test demonstrates moderate obstructive sleep apnea - by number of events - with a total AHI of 17.6/hour and O2 nadir of 91%. Intermittent mild snoring was noted. Treatment with positive airway pressure is recommended, due to complaint of recurrent headaches. The patient will be advised to proceed with an autoPAP titration/trial at home for now. Please note that untreated obstructive sleep apnea may carry additional perioperative morbidity. Patients with significant obstructive sleep apnea should receive perioperative PAP therapy and the surgeons and particularly the anesthesiologist should be informed of the diagnosis and the severity of the sleep disordered breathing. The patient should be cautioned not to drive, work at heights, or operate dangerous or heavy equipment when tired or sleepy. Review and reiteration of good sleep hygiene measures should be pursued with any patient. Other causes of the patient's symptoms, including circadian rhythm disturbances, an underlying mood disorder, medication  effect and/or an underlying medical problem cannot be ruled out based on this test. Clinical correlation is recommended. The patient and her referring provider will be notified of the test results. The patient will be seen in follow up in sleep clinic at [facility].  I certify that I have reviewed the raw data recording prior to the issuance of this report in accordance with the standards of the American Academy of Sleep Medicine (AASM).   INTERPRETING PHYSICIAN:  Star Age, MD, PhD  Board Certified in Neurology and Sleep Medicine  [facility] [address], [place], [place] [address] [phone]

## 2020-10-06 NOTE — Progress Notes (Signed)
Patient referred by Dr. Shelia Media for rec. HAs, seen by me on 09/07/20, HST on 09/30/20.    Please call and notify the patient that the recent sleep study showed obstructive sleep apnea. OSA is in the moderate range by numbers, with a total AHI of 17.6/hour and O2 nadir of 91%. Intermittent mild snoring was noted. OSA is worth treating to see if she feels better after treatment, esp with regards to her HAs. To that end I recommend treatment for this in the form of autoPAP, which means, that we don't have to bring her back for a second sleep study with CPAP, but will let him try an autoPAP machine at home, through a DME company (of her choice, or as per insurance requirement). The DME representative will educate her on how to use the machine, how to put the mask on, etc. I have placed an order in the chart. Please send referral, talk to patient, send report to referring MD. We will need a FU in sleep clinic for 10 weeks post-PAP set up, please arrange that with me or one of our NPs. Thanks,   Star Age, MD, PhD [facility])

## 2020-10-06 NOTE — Addendum Note (Signed)
Addended by: Star Age on: 10/06/2020 06:00 PM   Modules accepted: Orders

## 2020-10-08 ENCOUNTER — Telehealth: Payer: Self-pay

## 2020-10-08 NOTE — Telephone Encounter (Signed)
I called Lisa West. I advised Lisa West that Dr. Rexene Alberts reviewed their sleep study results and found that Lisa West has moderate osa. Dr. Rexene Alberts recommends that Lisa West start an autopap for treatment. I reviewed PAP compliance expectations with the Lisa West. Lisa West is agreeable to starting an auto-PAP. I advised Lisa West that an order will be sent to a DME, [facility], and [facility] will call the Lisa West within about one week after they file with the Lisa West's insurance. [facility] will show the Lisa West how to use the machine, fit for masks, and troubleshoot the auto-PAP if needed.Lisa West understands the shortage of CPAP machines right now and start date could be several weeks out. She will CB once started on the machine so 31-90 f/u can be set up.Lisa West verbalized understanding to arrive 15 minutes early and bring their auto-PAP. A letter with all of this information in it will be mailed to the Lisa West as a reminder. I verified with the Lisa West that the address we have on file is correct. Lisa West verbalized understanding of results. Lisa West had no questions at this time but was encouraged to call back if questions arise. I have sent the order to [facility] and have received confirmation that they have received the order.

## 2020-10-08 NOTE — Telephone Encounter (Signed)
-----   Message from Star Age, MD sent at 10/06/2020  6:00 PM EDT ----- Patient referred by Dr. Shelia Media for rec. HAs, seen by me on 09/07/20, HST on 09/30/20.    Please call and notify the patient that the recent sleep study showed obstructive sleep apnea. OSA is in the moderate range by numbers, with a total AHI of 17.6/hour and O2 nadir of 91%. Intermittent mild snoring was noted. OSA is worth treating to see if she feels better after treatment, esp with regards to her HAs. To that end I recommend treatment for this in the form of autoPAP, which means, that we don't have to bring her back for a second sleep study with CPAP, but will let him try an autoPAP machine at home, through a DME company (of her choice, or as per insurance requirement). The DME representative will educate her on how to use the machine, how to put the mask on, etc. I have placed an order in the chart. Please send referral, talk to patient, send report to referring MD. We will need a FU in sleep clinic for 10 weeks post-PAP set up, please arrange that with me or one of our NPs. Thanks,   Star Age, MD, PhD [facility])

## 2020-10-08 NOTE — Telephone Encounter (Signed)
Pt has returned the call to [facility] please call pt

## 2020-10-08 NOTE — Telephone Encounter (Signed)
I called pt. No answer, left a message asking pt to call me back.   

## 2020-10-21 NOTE — Progress Notes (Signed)
[address] [place] Phone: [phone] Subjective:   [name], am serving as a scribe for Dr. Hulan Saas. This visit occurred during the SARS-CoV-2 public health emergency.  Safety protocols were in place, including screening questions prior to the visit, additional usage of staff PPE, and extensive cleaning of exam room while observing appropriate contact time as indicated for disinfecting solutions.   I'm seeing this patient by the request  of:  Deland Pretty, MD  CC: Neck pain  [id number]  KENYADA DOSCH is a 50 y.o. female coming in with complaint of R arm tingling that radiates into entire hand. worse with looking up. Patient tries to sit up straight to alleviate tingling. Does not take any medication for symptoms.  Patient states that it is starting to increase in frequency.  Has not noticed any weakness.  Patient has been treated recently in the last couple years for a frozen shoulder on the contralateral side.  States that this does not feel like that.  Patient states even at nighttime can have the symptoms occur from time to time.  Patient is noticing it more at work when she is doing things.  States that this is starting to make her feel more concerned about it.  Patient denies any recent fevers, chills, any recent illnesses.  States that she has had laboratory work-up by primary care physician and it was fairly unremarkable.  Patient denies any type of neck pain or any recent injury.  Only occurs on the right side       Past Medical History:  Diagnosis Date  . Complication of anesthesia    hard to wake up  . History of kidney stones   . Kidney stone 03/2009  . Migraine    Past Surgical History:  Procedure Laterality Date  . ABDOMINAL HYSTERECTOMY Bilateral 05/07/2019   Procedure: HYSTERECTOMY ABDOMINAL with salpingectomy;  Surgeon: Molli Posey, MD;  Location: [facility];  Service:  Gynecology;  Laterality: Bilateral;  need bed  . CESAREAN SECTION  2002  . CYSTOSCOPY  2010   w/stent  . DILATION AND CURETTAGE OF UTERUS    . DILATION AND EVACUATION     X 2  . TONSILLECTOMY  1982   Social History   Socioeconomic History  . Marital status: Married    Spouse name: Not on file  . Number of children: 3  . Years of education: Not on file  . Highest education level: Not on file  Occupational History  . Occupation: Immunologist  Tobacco Use  . Smoking status: Never Smoker  . Smokeless tobacco: Never Used  Vaping Use  . Vaping Use: Never used  Substance and Sexual Activity  . Alcohol use: Yes    Alcohol/week: 4.0 standard drinks    Types: 4 Glasses of wine per week  . Drug use: Never  . Sexual activity: Not on file  Other Topics Concern  . Not on file  Social History Narrative   Lives with husband   Social Determinants of Health   Financial Resource Strain: Not on file  Food Insecurity: Not on file  Transportation Needs: Not on file  Physical Activity: Not on file  Stress: Not on file  Social Connections: Not on file   No Known Allergies Family History  Problem Relation Age of Onset  . Kidney disease Maternal Grandmother        kidney stones that cause her to lose one of  her kindeys   . Breast cancer Paternal Grandmother 95  . Colon cancer Neg Hx   . Esophageal cancer Neg Hx   . Rectal cancer Neg Hx   . Stomach cancer Neg Hx     Current Outpatient Medications (Endocrine & Metabolic):  .  predniSONE (DELTASONE) 20 MG tablet, Take 2 tablets (40 mg total) by mouth daily with breakfast.   Current Outpatient Medications (Respiratory):  .  fluticasone (FLONASE) 50 MCG/ACT nasal spray, SMARTSIG:2 Spray(s) Both Nares Every Night .  fluticasone (FLONASE) 50 MCG/ACT nasal spray, USE 2 SPRAYS IN EACH NOSTRIL AT BEDTIME .  ipratropium (ATROVENT) 0.06 % nasal spray, PLACE 2 SPRAYS IN EACH NOSTRIL AS NEEDED THREE TIMES DAILY FOR 14 DAYS.  Current Outpatient  Medications (Analgesics):  Marland Kitchen  UBRELVY 100 MG TABS,  .  Ubrogepant 100 MG TABS, TAKE 1 TABLET BY MOUTH. MAY TAKE SECOND DOSE AT LEAST 2 HOURS AFTER FIRST DOSE AS NEEDED .  Ubrogepant 100 MG TABS, TAKE 1 TABLET BY MOUTH. MAY TAKE SECOND DOSE AT LEAST 2 HOURS AFTER FIRST DOSE AS NEEDED ONCE A DAY   Current Outpatient Medications (Other):  .  gabapentin (NEURONTIN) 100 MG capsule, Take 2 capsules (200 mg total) by mouth at bedtime.   Reviewed prior external information including notes and imaging from  primary care provider As well as notes that were available from care everywhere and other healthcare systems.  Past medical history, social, surgical and family history all reviewed in electronic medical record.  No pertanent information unless stated regarding to the chief complaint.   Review of Systems:  No headache, visual changes, nausea, vomiting, diarrhea, constipation, dizziness, abdominal pain, skin rash, fevers, chills, night sweats, weight loss, swollen lymph nodes, body aches, joint swelling, chest pain, shortness of breath, mood change  Objective  Blood pressure 112/64, pulse 74, height 5\' 2"  (1.575 m), weight 122 lb (55.3 kg), SpO2 98 %.   General: No apparent distress alert and oriented x3 mood and affect normal, dressed appropriately.  HEENT: Pupils equal, extraocular movements intact  Respiratory: Patient's speak in full sentences and does not appear short of breath  Cardiovascular: No lower extremity edema, non tender, no erythema  Gait normal with good balance and coordination.  MSK:  Non tender with full range of motion and good stability and symmetric strength and tone of shoulders, elbows, wrist, hip, knee and ankles bilaterally.  Neck exam shows very minimal loss of lordosis.  Patient does though have worsening symptoms of radicular symptoms on the right arm with any type of extension. Patient does have some very mild weakness noted in the C8 distribution on the right  side.  No thenar eminence wasting noted.  When feeling patient's neck though does seem to have some mild fullness noted of the thyroid but seems to be more on the left than the right side.   Impression and Recommendations:     The above documentation has been reviewed and is accurate and complete Lyndal Pulley, DO

## 2020-10-22 ENCOUNTER — Other Ambulatory Visit: Payer: Self-pay

## 2020-10-22 ENCOUNTER — Ambulatory Visit (INDEPENDENT_AMBULATORY_CARE_PROVIDER_SITE_OTHER): Payer: 59

## 2020-10-22 ENCOUNTER — Other Ambulatory Visit (HOSPITAL_COMMUNITY): Payer: Self-pay

## 2020-10-22 ENCOUNTER — Ambulatory Visit (INDEPENDENT_AMBULATORY_CARE_PROVIDER_SITE_OTHER): Payer: 59 | Admitting: Family Medicine

## 2020-10-22 ENCOUNTER — Encounter: Payer: Self-pay | Admitting: Family Medicine

## 2020-10-22 VITALS — BP 112/64 | HR 74 | Ht 62.0 in | Wt 122.0 lb

## 2020-10-22 DIAGNOSIS — M5410 Radiculopathy, site unspecified: Secondary | ICD-10-CM | POA: Diagnosis not present

## 2020-10-22 DIAGNOSIS — M542 Cervicalgia: Secondary | ICD-10-CM

## 2020-10-22 DIAGNOSIS — R221 Localized swelling, mass and lump, neck: Secondary | ICD-10-CM

## 2020-10-22 IMAGING — DX DG CERVICAL SPINE COMPLETE 4+V
5 series · 5 of 5 positions shown · non-contrast
Comparison: None.

CLINICAL DATA: Cervical neck pain.

EXAM:
CERVICAL SPINE - COMPLETE 4+ VIEW

[c-spine lat]
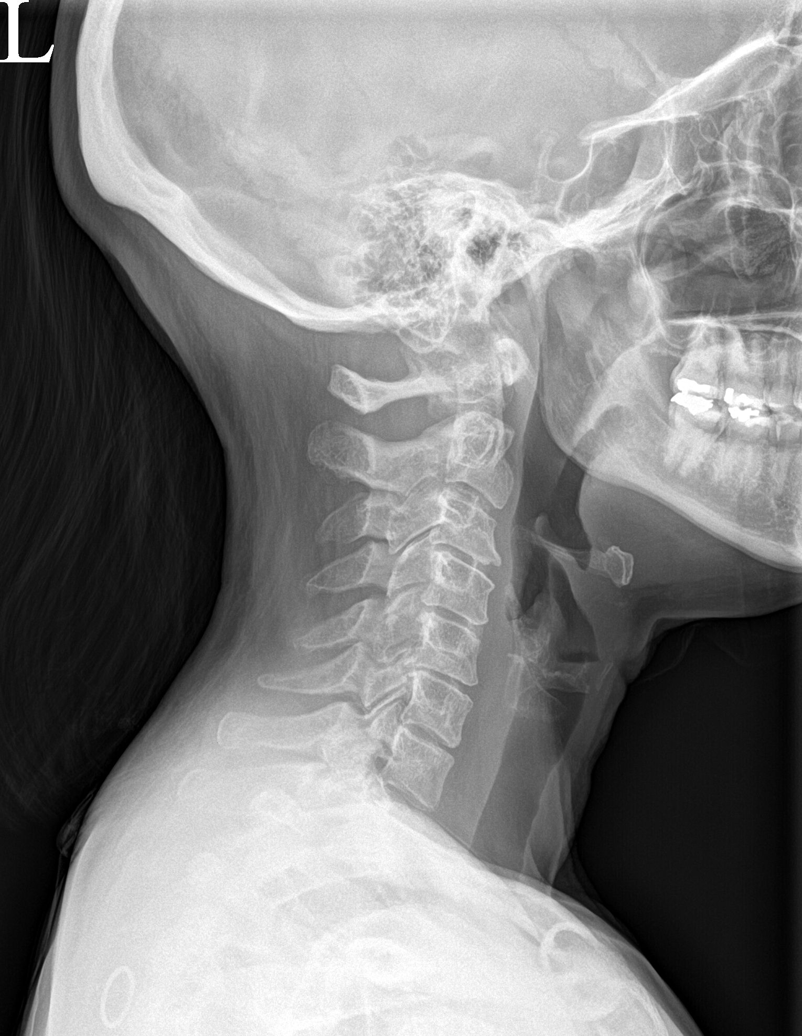

[c-spine obl (1 of 2)]
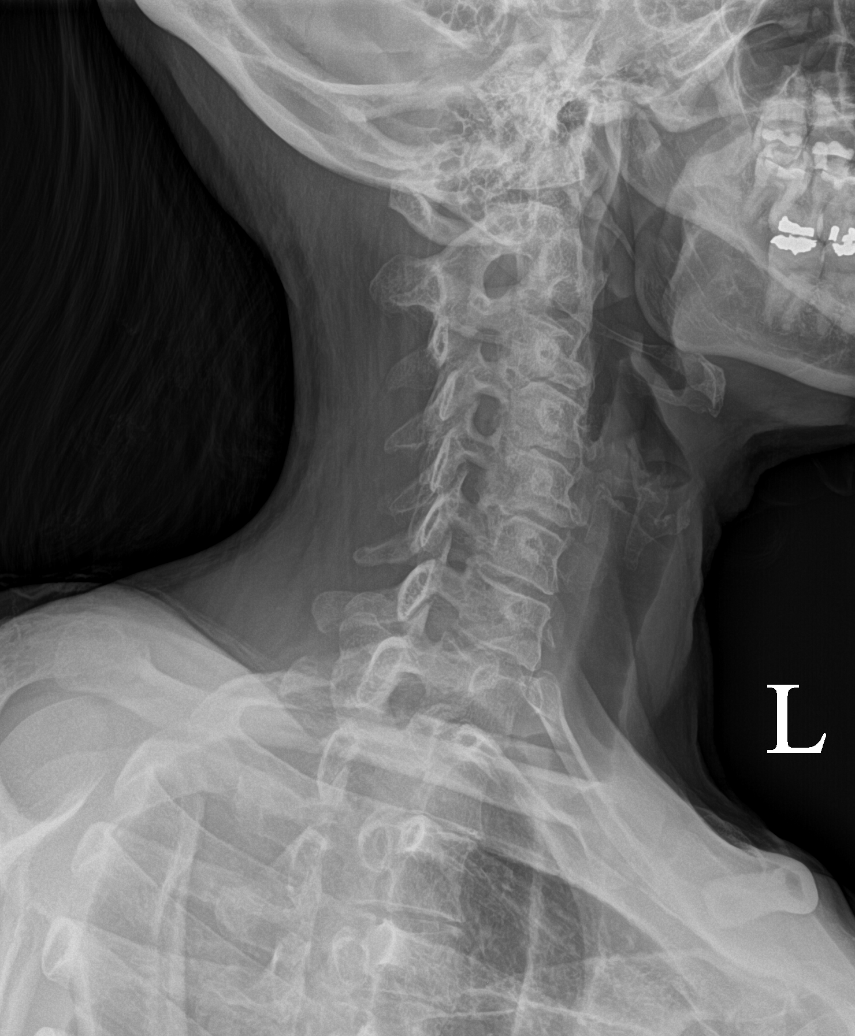

[c-spine obl (2 of 2)]
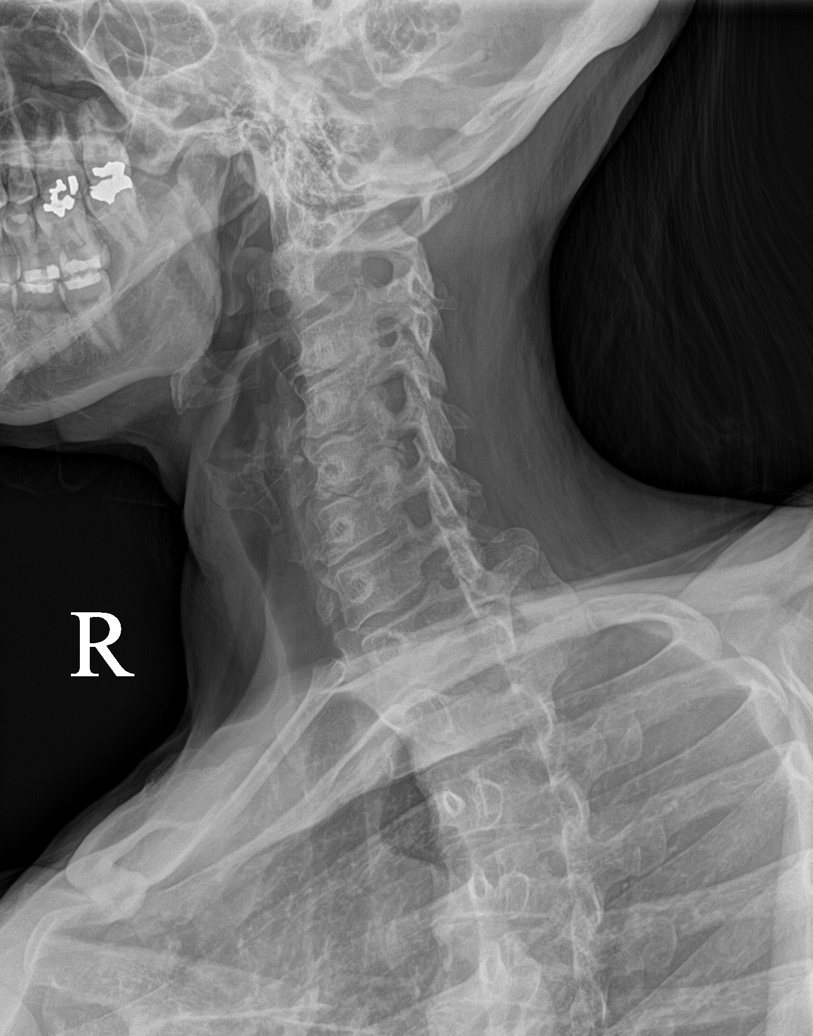

[c-spine ap]
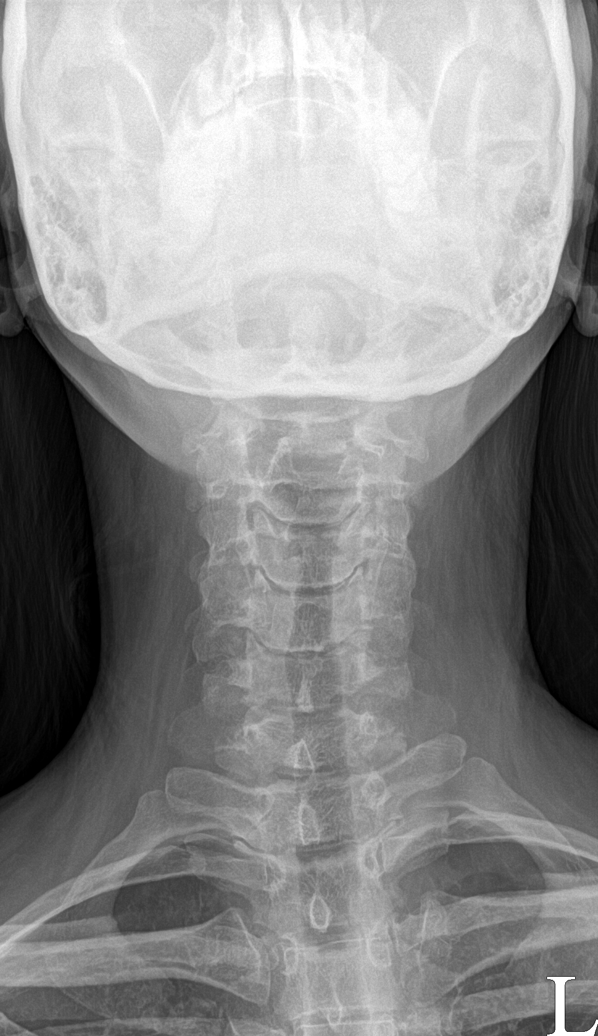

[c-spine open mouth]
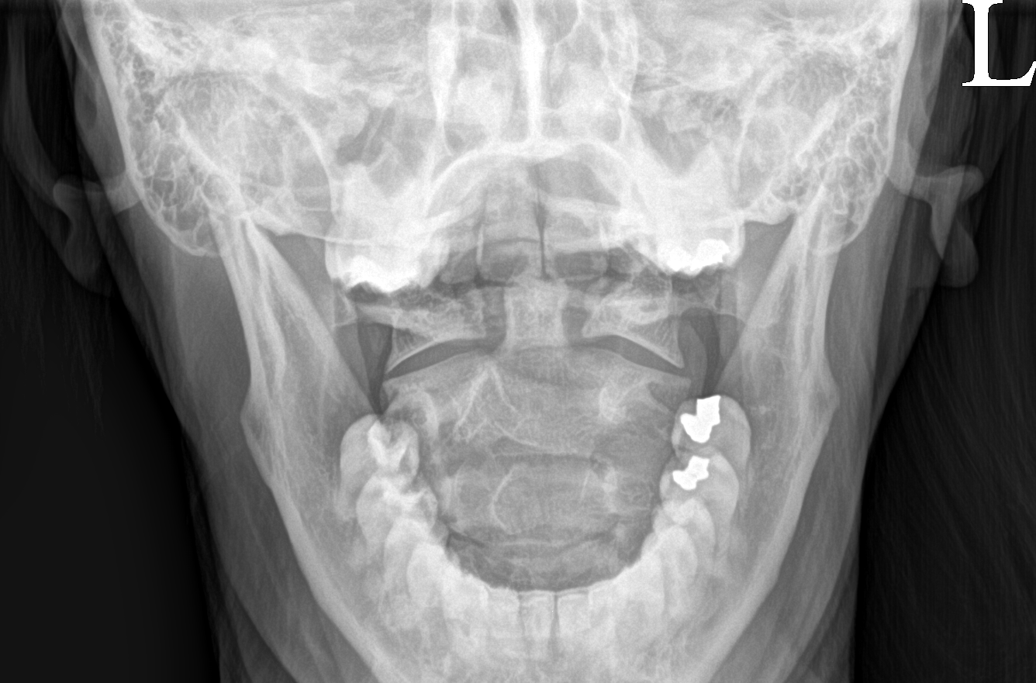

[5 of 5 positions shown; findings below may reference images not displayed]

FINDINGS: Cervical spine alignment is maintained. Vertebral body heights are
preserved. The dens is intact, lateral masses of C1 well seated on
C2. Posterior elements appear well-aligned. Multilevel endplate
spurring with mild disc space narrowing at C3-C4 and C5-C6. Mild
bony neural foraminal narrowing of C5-C6 bilaterally, more so on the
right. There is no evidence of fracture, focal bone lesion or bone
destruction. No prevertebral soft tissue edema.
IMPRESSION: Mild degenerative disc disease in the cervical spine. Mild bony
neural foraminal narrowing of C5-C6 bilaterally.

## 2020-10-22 MED ORDER — PREDNISONE 20 MG PO TABS
40.0000 mg | ORAL_TABLET | Freq: Every day | ORAL | 0 refills | Status: DC
  Filled 2020-10-22: qty 10, 5d supply, fill #0

## 2020-10-22 MED ORDER — GABAPENTIN 100 MG PO CAPS
200.0000 mg | ORAL_CAPSULE | Freq: Every day | ORAL | 0 refills | Status: AC
  Filled 2020-10-22: qty 180, 90d supply, fill #0

## 2020-10-22 NOTE — Assessment & Plan Note (Signed)
Patient is having more of a radiculopathy noted at this time.  Discussed with patient about icing regimen differential is quite broad.  Patient with any extension of the neck seems to have radicular symptoms affecting the entire hand.  Mild weakness noted though in the C8 distribution noted.  No thenar eminence wasting no noted of the hand.  Patient did have fullness of the neck and we will get an ultrasound of the thyroid to make sure that there is no other nodules or anything that could be contributing.  Patient recently has been diagnosed with sleep apnea by his surprisingly with patient's body habitus.  Discussed at this point we will try a short course of prednisone as well as gabapentin to see how patient responds.  Discussed icing regimen and home exercises given some scapular exercises to see if this will be beneficial.  Patient will follow up with me again in 3 weeks.  If continuing to have difficulty I do feel secondary to the increased frequency and duration patient is discussing in the mild weakness noted advanced imaging such as an MRI cervical and potentially even a CT of the soft tissue of the neck may be reasonable.

## 2020-10-22 NOTE — Patient Instructions (Addendum)
Xray today Thyroid US-They will call you Exercises 3x a week Prednisone 40mg  for 5 days Gabapentin 200mg  at night See me again in 3-4 weeks ok to double book

## 2020-10-27 ENCOUNTER — Ambulatory Visit
Admission: RE | Admit: 2020-10-27 | Discharge: 2020-10-27 | Disposition: A | Discharge status: Home / Self Care | Payer: 59 | Source: Ambulatory Visit | Attending: Family Medicine | Admitting: Family Medicine

## 2020-10-27 DIAGNOSIS — R221 Localized swelling, mass and lump, neck: Secondary | ICD-10-CM

## 2020-10-27 DIAGNOSIS — E041 Nontoxic single thyroid nodule: Secondary | ICD-10-CM | POA: Diagnosis not present

## 2020-10-27 IMAGING — US US THYROID
1 series · 13 of 25 positions shown · non-contrast
Comparison: None.

CLINICAL DATA: Palpable abnormality.

EXAM:
THYROID ULTRASOUND
TECHNIQUE: Ultrasound examination of the thyroid gland and adjacent soft
tissues was performed.

[Series 1: us thyroid · 0.04mm/px · 13 of 55 slices shown]
[im 1/55]
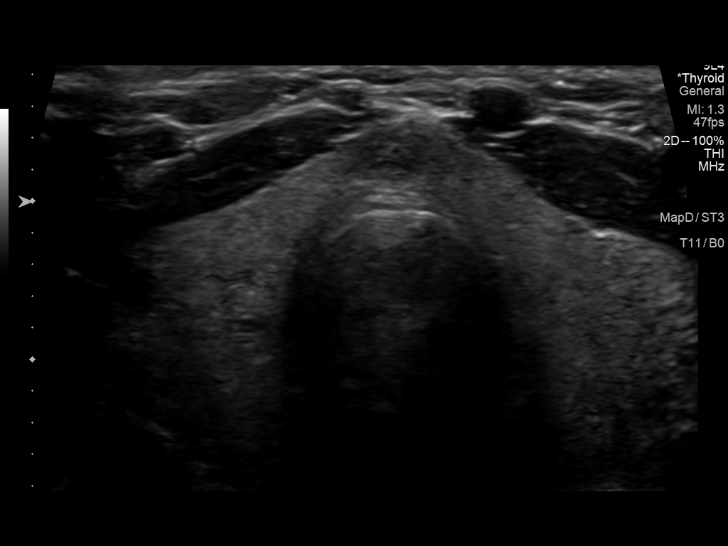
[im 5/55]
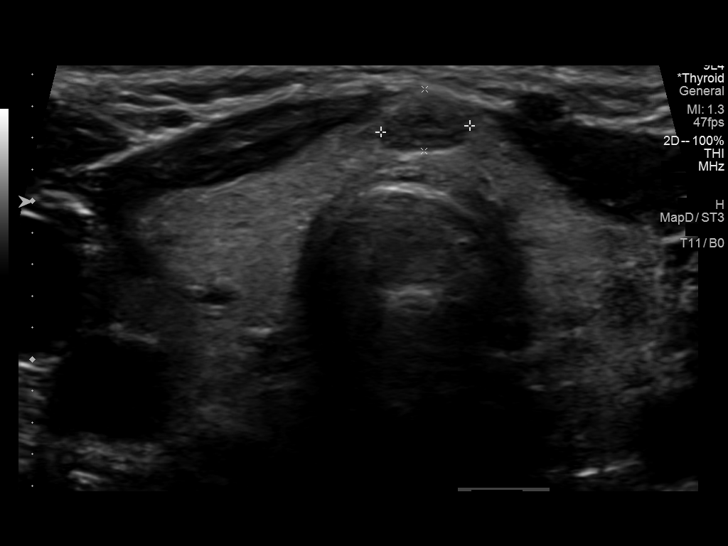
[im 10/55]
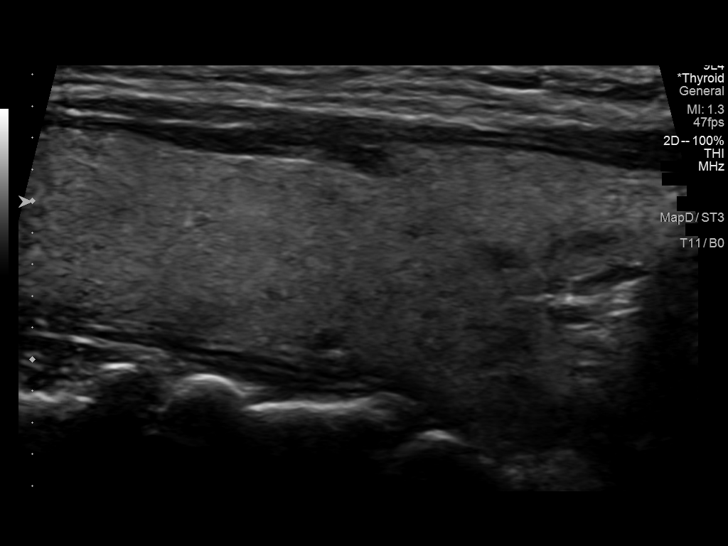
[im 14/55]
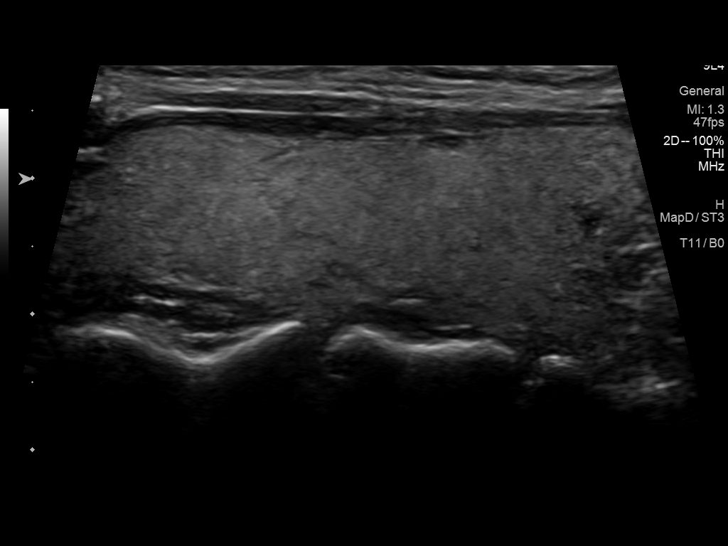
[im 19/55]
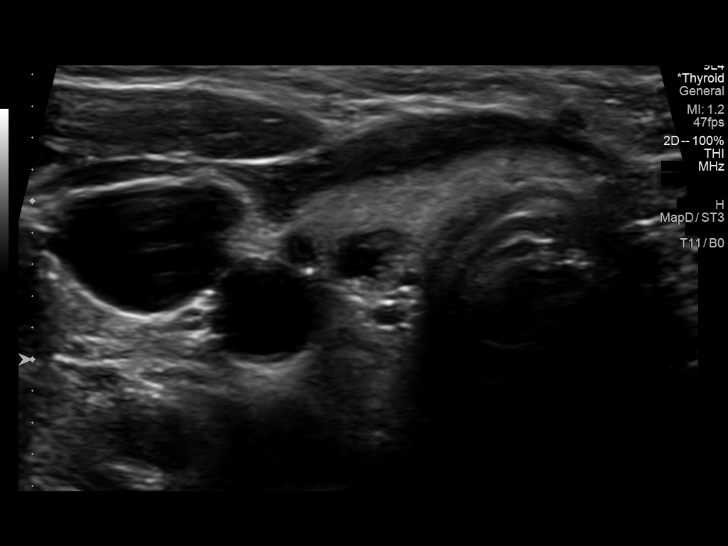
[im 23/55]
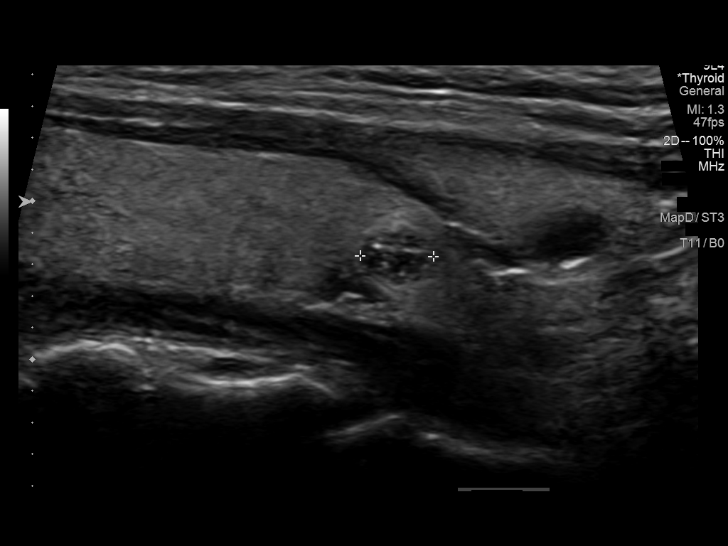
[im 28/55]
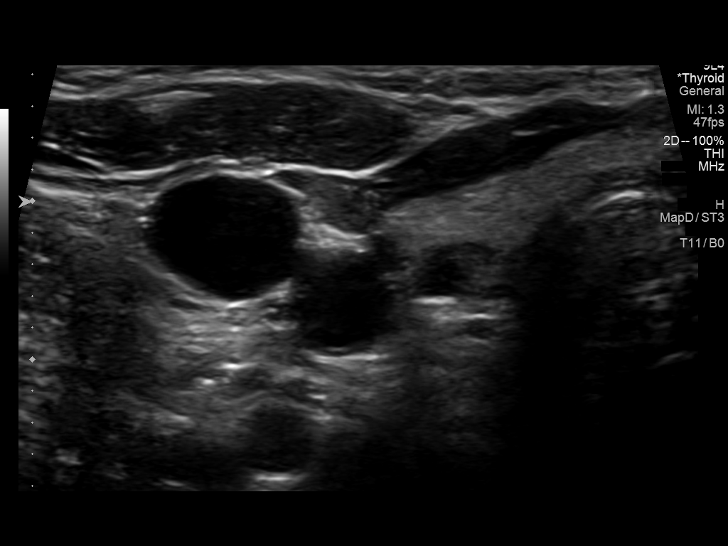
[im 32/55]
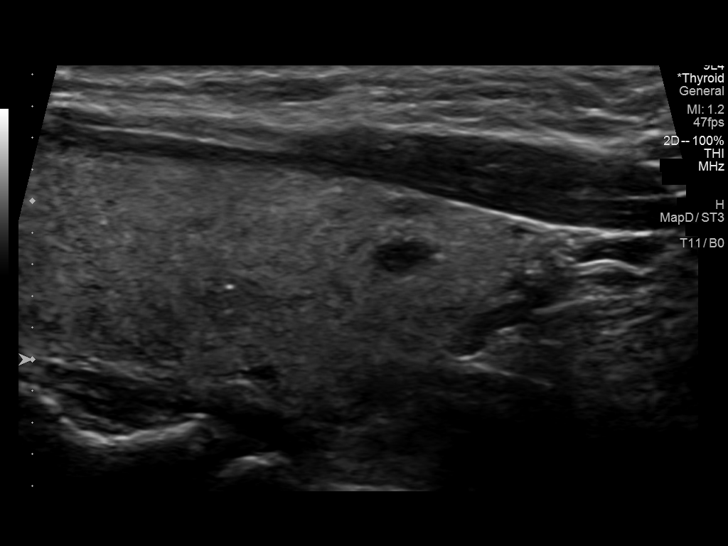
[im 37/55]
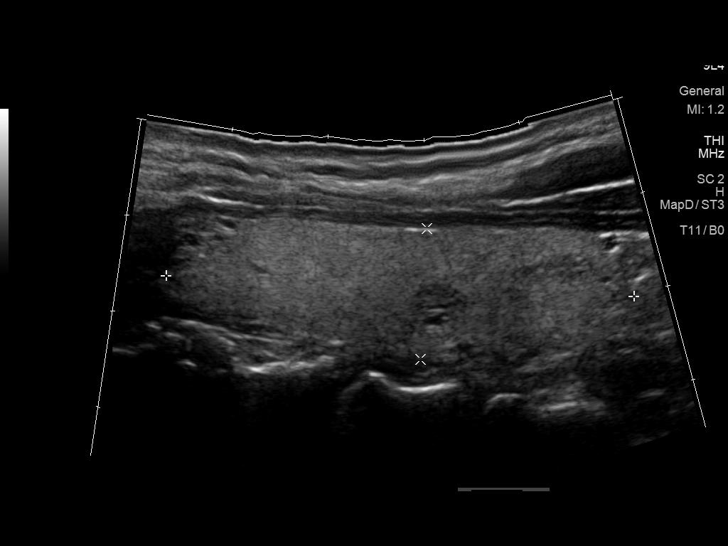
[im 41/55]
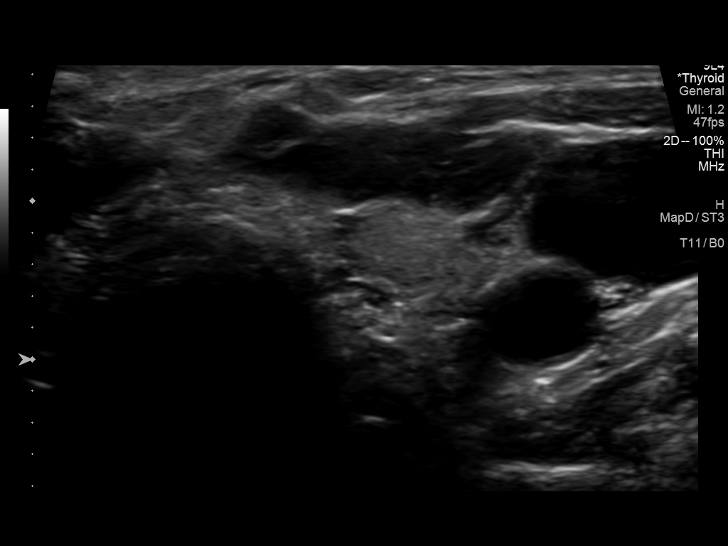
[im 46/55]
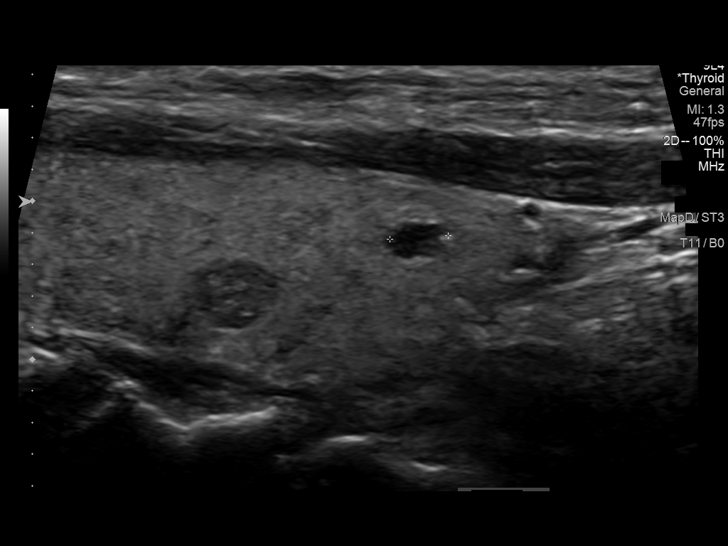
[im 50/55]
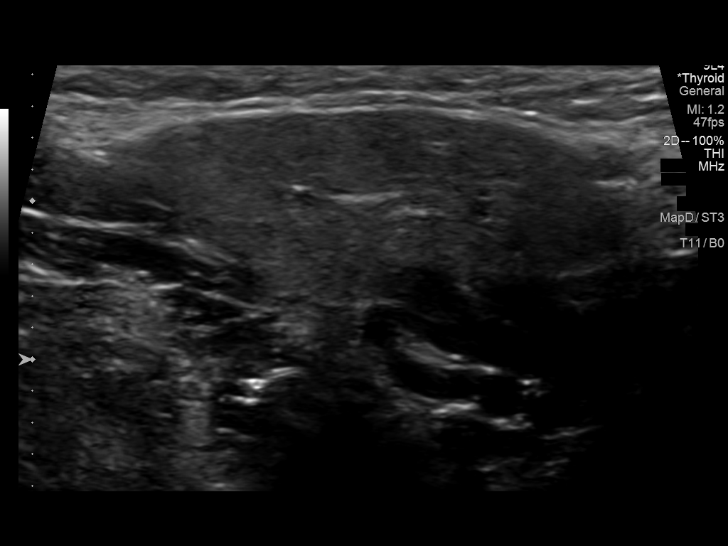
[im 55/55]
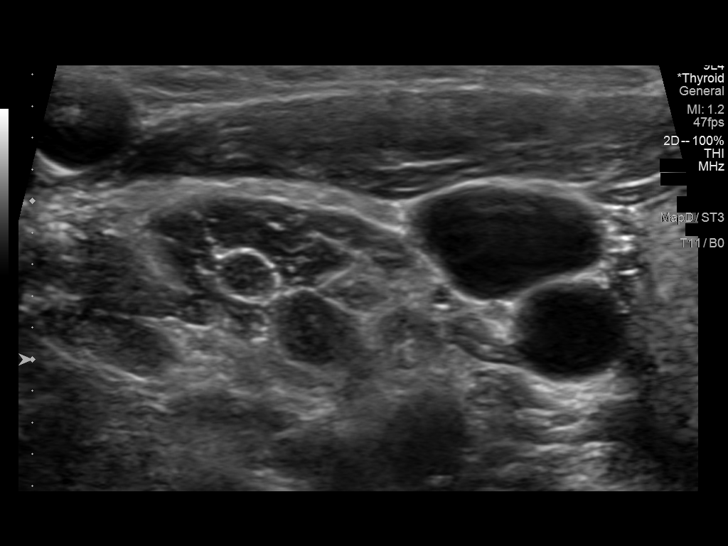

[13 of 25 positions shown; findings below may reference images not displayed]

FINDINGS: Parenchymal Echotexture: Mildly heterogenous

Isthmus: 0.5 cm

Right lobe: 5.7 x 1.8 x 1.7 cm

Left lobe: 4.8 x 1.4 x 1.5 cm

_________________________________________________________

Estimated total number of nodules >/= 1 cm: 0

Number of spongiform nodules >/=  2 cm not described below (TR1): 0

Number of mixed cystic and solid nodules >/= 1.5 cm not described
below (TR2): 0

_________________________________________________________

Nodule # 1:

Location: Isthmus; Mid

Maximum size: 0.8 cm; Other 2 dimensions: 0.6 x 0.4 cm

Composition: solid/almost completely solid (2)

Echogenicity: hypoechoic (2)

Shape: not taller-than-wide (0)

Margins: ill-defined (0)

Echogenic foci: none (0)

ACR TI-RADS total points: 4.

ACR TI-RADS risk category: TR4 (4-6 points).

ACR TI-RADS recommendations:

Given size (<0.9 cm) and appearance, this nodule does NOT meet
TI-RADS criteria for biopsy or dedicated follow-up.

_________________________________________________________

Nodule # 2:

Location: Right; Inferior

Maximum size: 0.8 cm; Other 2 dimensions: 0.6 x 0.4 cm

Composition: cystic/almost completely cystic (0)

Echogenicity: anechoic (0)

Shape: not taller-than-wide (0)

Margins: smooth (0)

Echogenic foci: none (0)

ACR TI-RADS total points: 0.

ACR TI-RADS risk category: TR1 (0-1 points).

ACR TI-RADS recommendations:

This nodule does NOT meet TI-RADS criteria for biopsy or dedicated
follow-up.

_________________________________________________________

Nodule # 3:

Location: Left; Mid

Maximum size: 0.6 cm; Other 2 dimensions: 0.5 x 0.5 cm

Composition: solid/almost completely solid (2)

Echogenicity: hypoechoic (2)

Shape: not taller-than-wide (0)

Margins: smooth (0)

Echogenic foci: none (0)

ACR TI-RADS total points: 4.

ACR TI-RADS risk category: TR4 (4-6 points).

ACR TI-RADS recommendations:

Given size (<0.9 cm) and appearance, this nodule does NOT meet
TI-RADS criteria for biopsy or dedicated follow-up.

_________________________________________________________
IMPRESSION: Mildly enlarged and mildly heterogeneous thyroid gland with a few
scattered benign appearing 0.8 cm or less nodules which do not meet
criteria for dedicated follow-up.

The above is in keeping with the ACR TI-RADS recommendations - [HOSPITAL] [6A];[DATE].

## 2020-11-18 NOTE — Progress Notes (Signed)
[address] [place] Phone: [phone] Subjective:   Lisa West, am serving as a scribe for Dr. Hulan Saas. This visit occurred during the SARS-CoV-2 public health emergency.  Safety protocols were in place, including screening questions prior to the visit, additional usage of staff PPE, and extensive cleaning of exam room while observing appropriate contact time as indicated for disinfecting solutions.   I'm seeing this patient by the request  of:  Lisa Pretty, MD  CC: Neck and arm difficulty follow-up  [id number]   10/22/2020 Patient is having more of a radiculopathy noted at this time.  Discussed with patient about icing regimen differential is quite broad.  Patient with any extension of the neck seems to have radicular symptoms affecting the entire hand.  Mild weakness noted though in the C8 distribution noted.  West thenar eminence wasting West noted of the hand.  Patient did have fullness of the neck and we will get an ultrasound of the thyroid to make sure that there is West other nodules or anything that could be contributing.  Patient recently has been diagnosed with sleep apnea by his surprisingly with patient's body habitus.  Discussed at this point we will try a short course of prednisone as well as gabapentin to see how patient responds.  Discussed icing regimen and home exercises given some scapular exercises to see if this will be beneficial.  Patient will follow up with me again in 3 weeks.  If continuing to have difficulty I do feel secondary to the increased frequency and duration patient is discussing in the mild weakness noted advanced imaging such as an MRI cervical and potentially even a CT of the soft tissue of the neck may be reasonable.  Update 11/19/2020 Lisa West is a 50 y.o. female coming in with complaint of cervical spine pain. Patient states that she continues to have tingling in entire arm.  Prednisone did not help to alleviate her symptoms.  Patient has not noticed any improvement with the gabapentin either.  Patient still has the certain positioning that causes more of a radicular symptoms that goes all the way to the hand still.  Patient still states that there is West significant type of pain now.   Xray cervical 10/22/2020 IMPRESSION: Mild degenerative disc disease in the cervical spine. Mild bony neural foraminal narrowing of C5-C6 bilaterally.  Thyroid US 10/27/2020 IMPRESSION: Mildly enlarged and mildly heterogeneous thyroid gland with a few scattered benign appearing 0.8 cm or less nodules which do not meet criteria for dedicated follow-up.     Past Medical History:  Diagnosis Date  . Complication of anesthesia    hard to wake up  . History of kidney stones   . Kidney stone 03/2009  . Migraine    Past Surgical History:  Procedure Laterality Date  . ABDOMINAL HYSTERECTOMY Bilateral 05/07/2019   Procedure: HYSTERECTOMY ABDOMINAL with salpingectomy;  Surgeon: Lisa Posey, MD;  Location: [facility];  Service: Gynecology;  Laterality: Bilateral;  need bed  . CESAREAN SECTION  2002  . CYSTOSCOPY  2010   w/stent  . DILATION AND CURETTAGE OF UTERUS    . DILATION AND EVACUATION     X 2  . TONSILLECTOMY  1982   Social History   Socioeconomic History  . Marital status: Married    Spouse name: Not on file  . Number of children: 3  . Years of education: Not on file  . Highest  education level: Not on file  Occupational History  . Occupation: Immunologist  Tobacco Use  . Smoking status: Never Smoker  . Smokeless tobacco: Never Used  Vaping Use  . Vaping Use: Never used  Substance and Sexual Activity  . Alcohol use: Yes    Alcohol/week: 4.0 standard drinks    Types: 4 Glasses of wine per week  . Drug use: Never  . Sexual activity: Not on file  Other Topics Concern  . Not on file  Social History Narrative   Lives with husband   Social  Determinants of Health   Financial Resource Strain: Not on file  Food Insecurity: Not on file  Transportation Needs: Not on file  Physical Activity: Not on file  Stress: Not on file  Social Connections: Not on file   West Known Allergies Family History  Problem Relation Age of Onset  . Kidney disease Maternal Grandmother        kidney stones that cause her to lose one of her kindeys   . Breast cancer Paternal Grandmother 95  . Colon cancer Neg Hx   . Esophageal cancer Neg Hx   . Rectal cancer Neg Hx   . Stomach cancer Neg Hx        Current Outpatient Medications (Analgesics):  Marland Kitchen  UBRELVY 100 MG TABS,  .  Ubrogepant 100 MG TABS, TAKE 1 TABLET BY MOUTH. MAY TAKE SECOND DOSE AT LEAST 2 HOURS AFTER FIRST DOSE AS NEEDED .  Ubrogepant 100 MG TABS, TAKE 1 TABLET BY MOUTH. MAY TAKE SECOND DOSE AT LEAST 2 HOURS AFTER FIRST DOSE AS NEEDED ONCE A DAY   Current Outpatient Medications (Other):  .  gabapentin (NEURONTIN) 100 MG capsule, Take 2 capsules (200 mg total) by mouth at bedtime.   Reviewed prior external information including notes and imaging from  primary care provider As well as notes that were available from care everywhere and other healthcare systems.  Past medical history, social, surgical and family history all reviewed in electronic medical record.  West pertanent information unless stated regarding to the chief complaint.   Review of Systems:  West headache, visual changes, nausea, vomiting, diarrhea, constipation, dizziness, abdominal pain, skin rash, fevers, chills, night sweats, weight loss, swollen lymph nodes, body aches, joint swelling, chest pain, shortness of breath, mood changes.   Objective  Blood pressure 106/80, pulse 71, height 5\' 2"  (1.575 m), weight 122 lb (55.3 kg), SpO2 98 %.   General: West apparent distress alert and oriented x3 mood and affect normal, dressed appropriately.  HEENT: Pupils equal, extraocular movements intact  Respiratory: Patient's  speak in full sentences and does not appear short of breath  Cardiovascular: West lower extremity edema, non tender, West erythema  Gait normal with good balance and coordination.  MSK:  Non tender with full range of motion and good stability and symmetric strength and tone of shoulders, elbows, wrist, hip, knee and ankles bilaterally.  Neck exam does have some mild loss of lordosis.  Lacks the last 10 degrees of extension.  Patient does have radicular symptoms more in the C5-C6 distribution on the right side with any type of extension or Spurling's.  Of the neck.  Patient has a negative radicular symptoms with compression of the thoracic outlet.  Good grip strength noted.  Deep tendon reflexes do continue to be intact.   Impression and Recommendations:     The above documentation has been reviewed and is accurate and complete Lyndal Pulley, DO

## 2020-11-19 ENCOUNTER — Ambulatory Visit (INDEPENDENT_AMBULATORY_CARE_PROVIDER_SITE_OTHER): Payer: 59 | Admitting: Family Medicine

## 2020-11-19 ENCOUNTER — Other Ambulatory Visit: Payer: Self-pay

## 2020-11-19 ENCOUNTER — Encounter: Payer: Self-pay | Admitting: Family Medicine

## 2020-11-19 VITALS — BP 106/80 | HR 71 | Ht 62.0 in | Wt 122.0 lb

## 2020-11-19 DIAGNOSIS — M2550 Pain in unspecified joint: Secondary | ICD-10-CM

## 2020-11-19 DIAGNOSIS — M5410 Radiculopathy, site unspecified: Secondary | ICD-10-CM | POA: Diagnosis not present

## 2020-11-19 LAB — IBC PANEL
Iron: 143 ug/dL (ref 42–145)
Saturation Ratios: 47.1 % (ref 20.0–50.0)
Transferrin: 217 mg/dL (ref 212.0–360.0)

## 2020-11-19 LAB — CBC WITH DIFFERENTIAL/PLATELET
Basophils Absolute: 0.1 10*3/uL (ref 0.0–0.1)
Basophils Relative: 0.9 % (ref 0.0–3.0)
Eosinophils Absolute: 0.1 10*3/uL (ref 0.0–0.7)
Eosinophils Relative: 2.1 % (ref 0.0–5.0)
HCT: 38.6 % (ref 36.0–46.0)
Hemoglobin: 13.3 g/dL (ref 12.0–15.0)
Lymphocytes Relative: 22.9 % (ref 12.0–46.0)
Lymphs Abs: 1.2 10*3/uL (ref 0.7–4.0)
MCHC: 34.4 g/dL (ref 30.0–36.0)
MCV: 89.6 fl (ref 78.0–100.0)
Monocytes Absolute: 0.4 10*3/uL (ref 0.1–1.0)
Monocytes Relative: 7.1 % (ref 3.0–12.0)
Neutro Abs: 3.6 10*3/uL (ref 1.4–7.7)
Neutrophils Relative %: 67 % (ref 43.0–77.0)
Platelets: 241 10*3/uL (ref 150.0–400.0)
RBC: 4.31 Mil/uL (ref 3.87–5.11)
RDW: 13.2 % (ref 11.5–15.5)
WBC: 5.3 10*3/uL (ref 4.0–10.5)

## 2020-11-19 LAB — COMPREHENSIVE METABOLIC PANEL
ALT: 15 U/L (ref 0–35)
AST: 16 U/L (ref 0–37)
Albumin: 4.3 g/dL (ref 3.5–5.2)
Alkaline Phosphatase: 47 U/L (ref 39–117)
BUN: 19 mg/dL (ref 6–23)
CO2: 29 mEq/L (ref 19–32)
Calcium: 9.1 mg/dL (ref 8.4–10.5)
Chloride: 104 mEq/L (ref 96–112)
Creatinine, Ser: 0.67 mg/dL (ref 0.40–1.20)
GFR: 101.58 mL/min (ref >60.00)
Glucose, Bld: 95 mg/dL (ref 70–99)
Potassium: 4 mEq/L (ref 3.5–5.1)
Sodium: 139 mEq/L (ref 135–145)
Total Bilirubin: 0.7 mg/dL (ref 0.2–1.2)
Total Protein: 6.9 g/dL (ref 6.0–8.3)

## 2020-11-19 LAB — TSH: TSH: 1.71 u[IU]/mL (ref 0.35–4.50)

## 2020-11-19 LAB — URIC ACID: Uric Acid, Serum: 6 mg/dL (ref 2.4–7.0)

## 2020-11-19 LAB — VITAMIN D 25 HYDROXY (VIT D DEFICIENCY, FRACTURES): VITD: 32.81 ng/mL (ref 30.00–100.00)

## 2020-11-19 LAB — SEDIMENTATION RATE: Sed Rate: 2 mm/hr (ref 0–30)

## 2020-11-19 NOTE — Patient Instructions (Addendum)
MRI cervical [phone] Gabapentin 300mg  at night for a couple of nights Will be in contact with results Labs today

## 2020-11-19 NOTE — Assessment & Plan Note (Signed)
Continuing to have radicular symptoms noted on the upper extremity.  Continues to not have any significant pain but patient does still have the increasing radicular symptoms with any type of extension of the neck.  Patient's x-rays show the patient did have mild degenerative changes mostly with some foraminal narrowing at the C5-C6 level bilaterally but does seem to be right greater than left.  Due to the longevity of this and patient not responding to conservative therapy I do feel that advanced imaging with an MRI is necessary at this time.  Depending on findings we may discuss the possibility of injections or possibly nerve conduction studies.  Laboratory work-up including thyroid levels will be obtained today.  Patient did have the ultrasound of the thyroid but no large masses noted that needed any type of further care.  Follow-up after imaging to discuss

## 2020-11-21 LAB — PTH, INTACT AND CALCIUM
Calcium: 8.9 mg/dL (ref 8.6–10.4)
PTH: 27 pg/mL (ref 16–77)

## 2020-11-24 ENCOUNTER — Other Ambulatory Visit (HOSPITAL_COMMUNITY): Payer: Self-pay

## 2020-11-24 MED ORDER — CARESTART COVID-19 HOME TEST VI KIT
PACK | 0 refills | Status: AC
  Filled 2020-11-24: qty 4, 4d supply, fill #0

## 2020-12-07 ENCOUNTER — Ambulatory Visit: Payer: 59 | Admitting: Family Medicine

## 2020-12-12 ENCOUNTER — Ambulatory Visit
Admission: RE | Admit: 2020-12-12 | Discharge: 2020-12-12 | Disposition: A | Discharge status: Home / Self Care | Payer: 59 | Source: Ambulatory Visit | Attending: Family Medicine | Admitting: Family Medicine

## 2020-12-12 DIAGNOSIS — M4722 Other spondylosis with radiculopathy, cervical region: Secondary | ICD-10-CM | POA: Diagnosis not present

## 2020-12-12 DIAGNOSIS — M5410 Radiculopathy, site unspecified: Secondary | ICD-10-CM

## 2020-12-12 IMAGING — MR MR CERVICAL SPINE W/O CM
4 of 5 series · 29 of 48 positions shown · non-contrast
Comparison: Cervical spine radiographs [DATE]

CLINICAL DATA: Cervical radiculopathy. Right arm paresthesias and
numbness when turning to the right.

EXAM:
MRI CERVICAL SPINE WITHOUT CONTRAST
TECHNIQUE: Multiplanar, multisequence MR imaging of the cervical spine was
performed. No intravenous contrast was administered.

[Series 3: T2 · sagittal · 3.0mm · 0.66mm/px · 7 of 15 slices shown (1 of 2)]
[im 1/15]
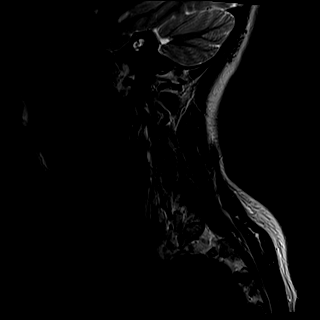
[im 3/15]
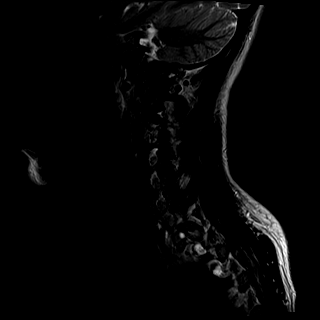
[im 5/15]
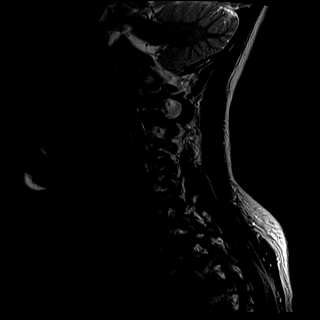
[im 8/15]
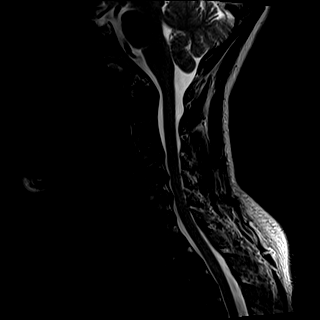
[im 10/15]
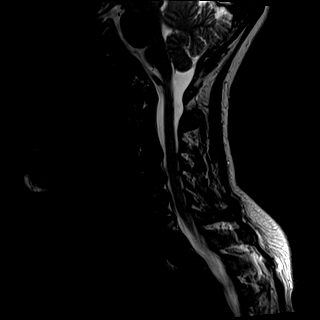
[im 12/15]
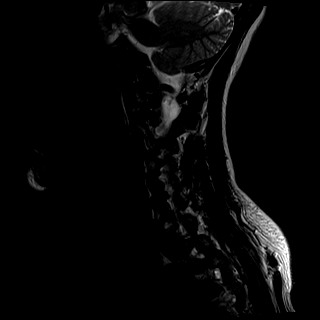
[im 15/15]
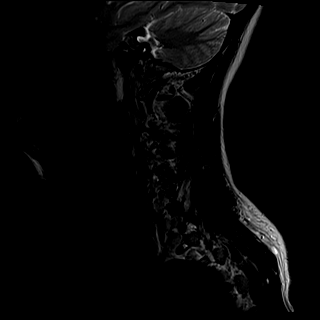

[Series 4: T1 · sagittal · 3.0mm · 0.41mm/px · 8 of 15 slices shown]
[im 1/15]
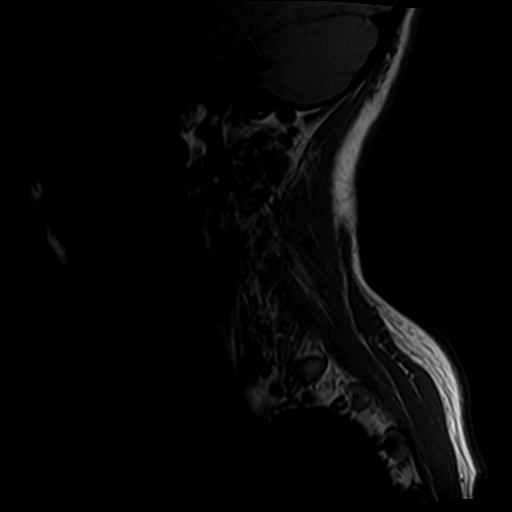
[im 3/15]
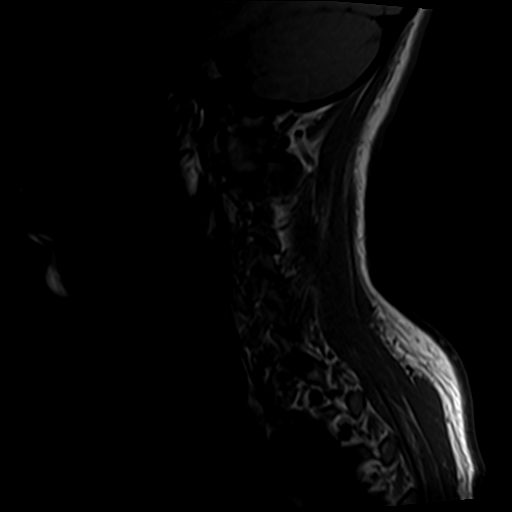
[im 5/15]
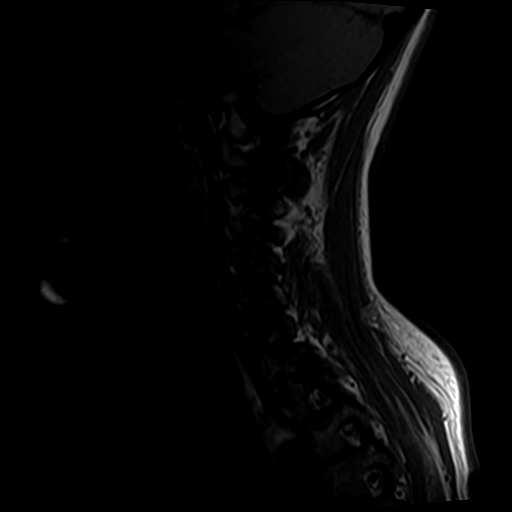
[im 7/15]
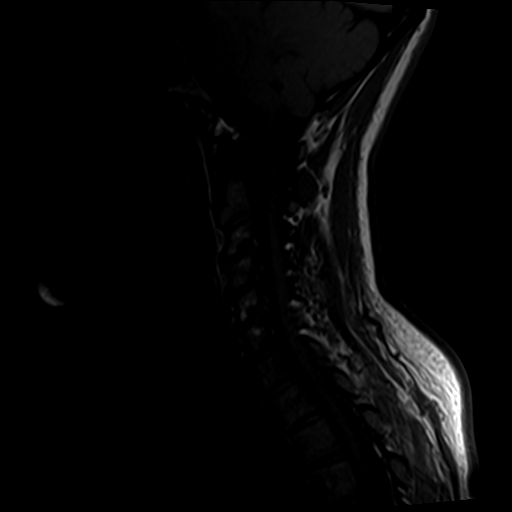
[im 9/15]
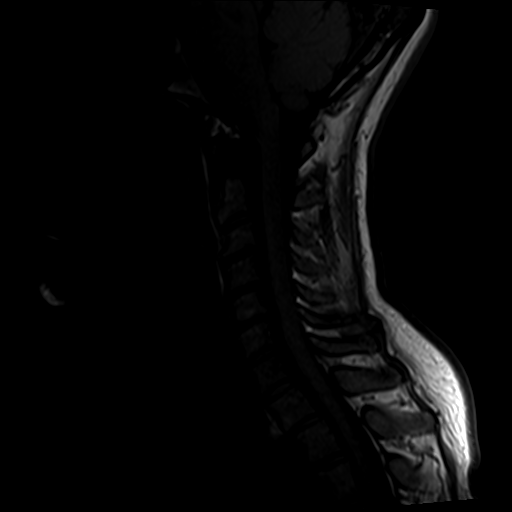
[im 11/15]
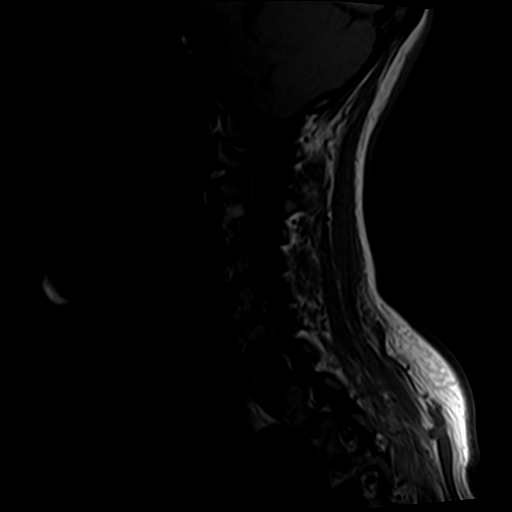
[im 13/15]
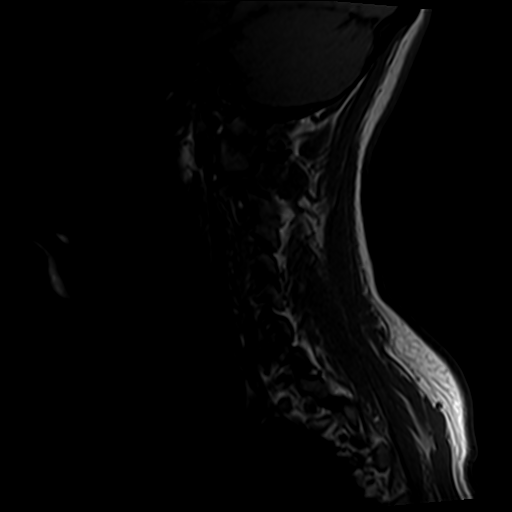
[im 15/15]
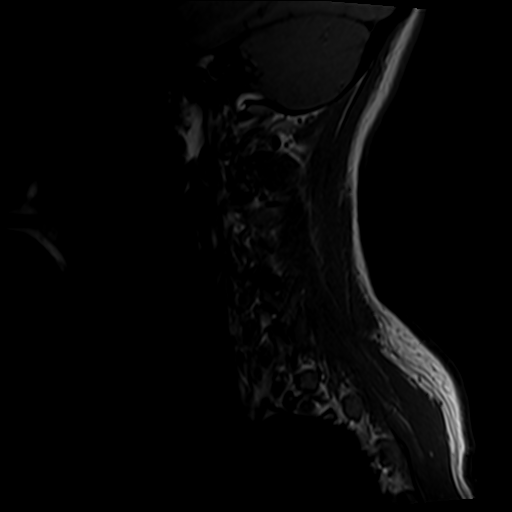

[Series 5: tir sag · sagittal · 3.0mm · 0.41mm/px · 5 of 15 slices shown]
[im 1/15]
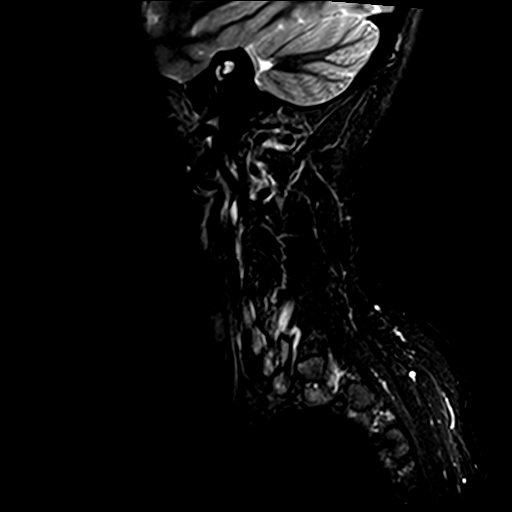
[im 3/15]
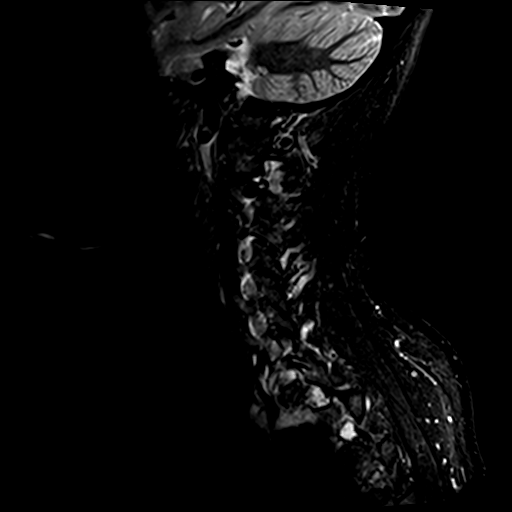
[im 5/15]
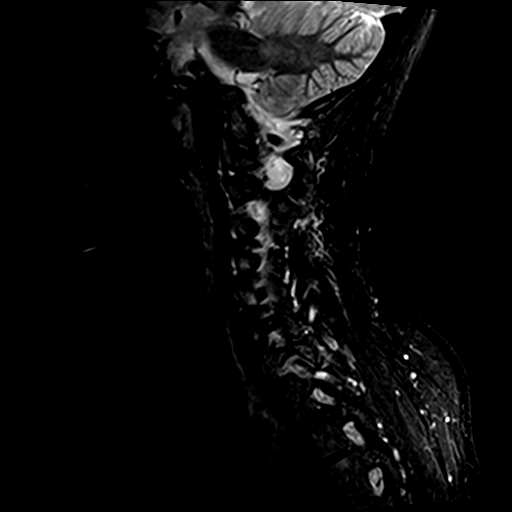
[im 9/15]
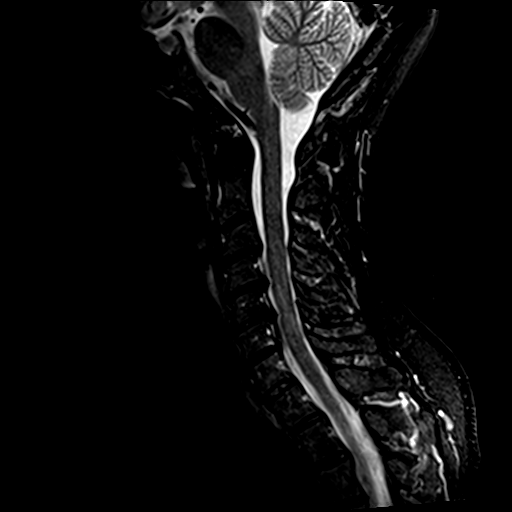
[im 13/15]
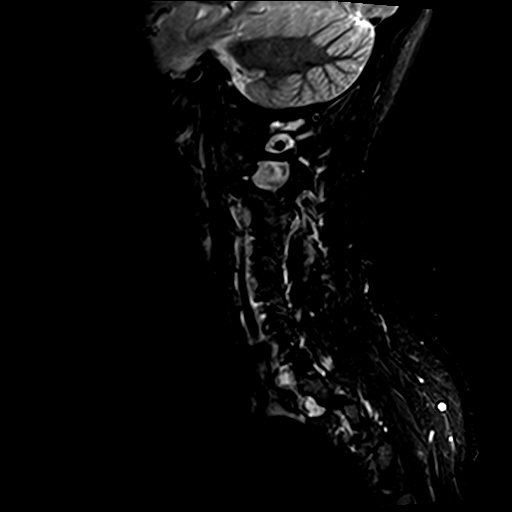

[Series 7: T2 · axial · 3.0mm · 0.70mm/px · z∈[-26,+61]mm · 9 of 24 slices shown (2 of 2)]
[im 1/24]
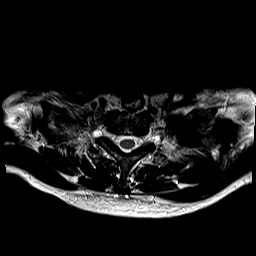
[im 5/24]
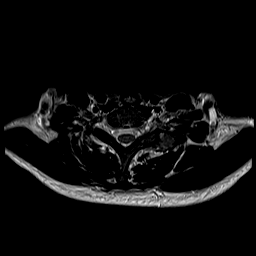
[im 7/24]
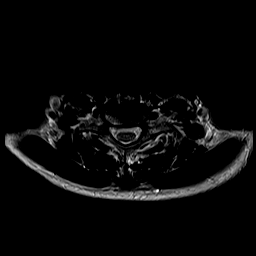
[im 11/24]
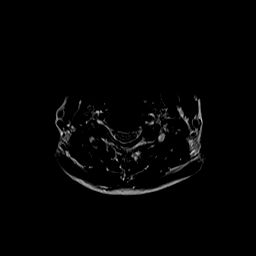
[im 13/24]
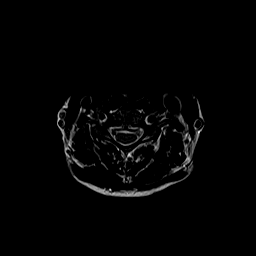
[im 17/24]
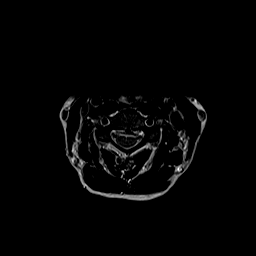
[im 19/24]
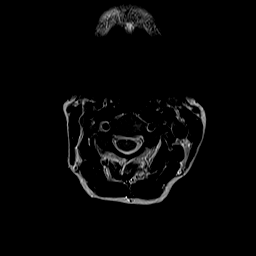
[im 21/24]
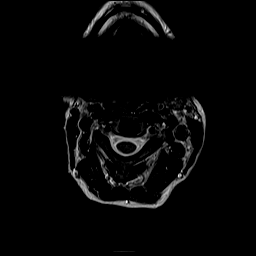
[im 24/24]
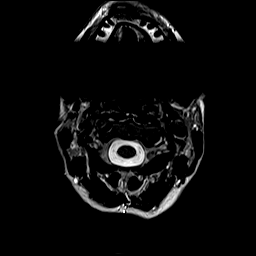

[29 of 48 positions shown; findings below may reference images not displayed]

FINDINGS: Alignment: Slight degenerative retrolisthesis is present at C3-4,
C4-5, and C5-6. There is mild straightening of the normal cervical
lordosis.

Vertebrae: Mild edematous endplate changes are present in the right
at C6-7. Chronic fatty endplate marrow changes present on the left
at C5-6. Posterior elements are fused on the right at C2-3, likely
congenital.

Cord: Normal signal and morphology.

Posterior Fossa, vertebral arteries, paraspinal tissues:
Craniocervical junction is normal. Flow is present in the vertebral
arteries bilaterally. Visualized intracranial contents are normal.

Disc levels:

C2-3: Negative.

C3-4: Asymmetric left-sided uncovertebral and facet hypertrophy
results in moderate left and mild right foraminal stenosis.

C4-5: A broad-based disc osteophyte complex is asymmetric to the
left. Moderate left foraminal narrowing is due to uncovertebral
spurring.

C5-6: A broad-based disc osteophyte complex is present.
Uncovertebral spurring results in severe left and moderate right
foraminal narrowing. Partial effacement of the ventral CSF is noted.

C6-7: Asymmetric right-sided uncovertebral spurring is present.
Moderate to severe right foraminal narrowing is present. Partial
effacement of ventral CSF is noted.

C7-T1: Negative.
IMPRESSION: 1. Multilevel spondylosis of the cervical spine as described.
2. Moderate left and mild right foraminal stenosis at C3-4.
3. Moderate left foraminal narrowing at C4-5.
4. Severe left and moderate right foraminal narrowing at C5-6.
5. Moderate to severe right foraminal stenosis at C6-7.

## 2020-12-13 ENCOUNTER — Encounter: Payer: Self-pay | Admitting: Family Medicine

## 2020-12-14 ENCOUNTER — Other Ambulatory Visit: Payer: Self-pay

## 2020-12-14 DIAGNOSIS — M5412 Radiculopathy, cervical region: Secondary | ICD-10-CM

## 2020-12-29 ENCOUNTER — Other Ambulatory Visit: Payer: Self-pay

## 2020-12-29 ENCOUNTER — Ambulatory Visit
Admission: RE | Admit: 2020-12-29 | Discharge: 2020-12-29 | Disposition: A | Discharge status: Home / Self Care | Payer: 59 | Source: Ambulatory Visit | Attending: Family Medicine | Admitting: Family Medicine

## 2020-12-29 DIAGNOSIS — M5412 Radiculopathy, cervical region: Secondary | ICD-10-CM

## 2020-12-29 IMAGING — XA DG INJECT/[PERSON_NAME] INC NEEDLE/CATH/PLC EPI/CERV/THOR W/IMG
2 series · 2 of 2 positions shown · non-contrast
Comparison: none

CLINICAL DATA: Right radicular pain.  Right foraminal stenoses.

[Series 1: ortho standard · 1 of 1 slices shown (1 of 2)]
[im 1/1]
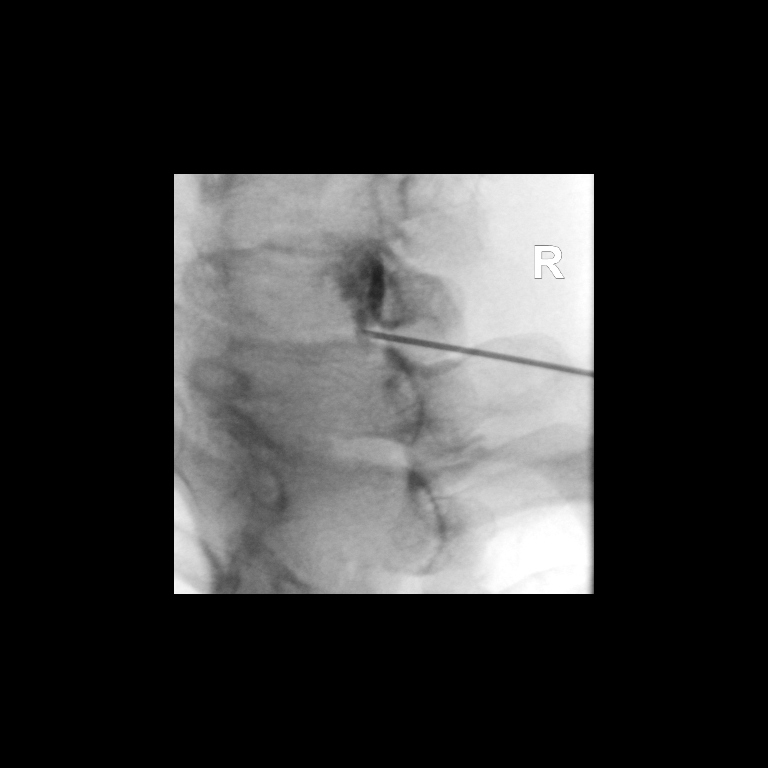

[Series 2: ortho standard · 1 of 1 slices shown (2 of 2)]
[im 1/1]
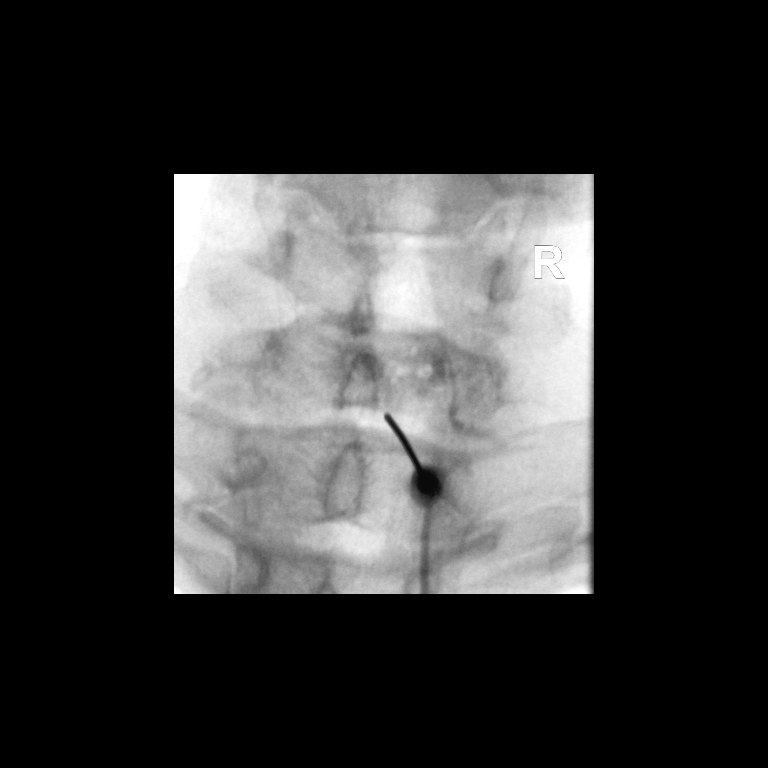

[2 of 2 positions shown; findings below may reference images not displayed]

FLUOROSCOPY TIME:  0 minutes 42 seconds. 10.44 micro gray meter
squared

PROCEDURE:
CERVICAL EPIDURAL INJECTION

An interlaminar approach was performed on the right at C7-T1. A 20
gauge epidural needle was advanced using loss-of-resistance
technique.

DIAGNOSTIC EPIDURAL INJECTION

Injection of Isovue-M 300 shows a good epidural pattern with spread
above and below the level of needle placement, primarily on the
right. No vascular opacification is seen. THERAPEUTIC

EPIDURAL INJECTION

1.5 ml of Kenalog 40 mixed with 1 ml of 1% Lidocaine and 2 ml of
normal saline were then instilled. The procedure was well-tolerated,
and the patient was discharged thirty minutes following the
injection in good condition.
IMPRESSION: Technically successful initial epidural injection on the right at
C7-T1.

## 2020-12-29 MED ORDER — IOPAMIDOL (ISOVUE-M 300) INJECTION 61%
1.0000 mL | Freq: Once | INTRAMUSCULAR | Status: AC
  Administered 2020-12-29: 1 mL via EPIDURAL

## 2020-12-29 MED ORDER — TRIAMCINOLONE ACETONIDE 40 MG/ML IJ SUSP (RADIOLOGY)
60.0000 mg | Freq: Once | INTRAMUSCULAR | Status: AC
  Administered 2020-12-29: 60 mg via EPIDURAL

## 2020-12-29 NOTE — Discharge Instructions (Signed)

## 2021-01-20 DIAGNOSIS — G4733 Obstructive sleep apnea (adult) (pediatric): Secondary | ICD-10-CM | POA: Diagnosis not present

## 2021-02-10 NOTE — Progress Notes (Signed)
Lisa West [place] Phone: [phone] Subjective:   [name], am serving as a scribe for Dr. Hulan Saas. This visit occurred during the SARS-CoV-2 public health emergency.  Safety protocols were in place, including screening questions prior to the visit, additional usage of staff PPE, and extensive cleaning of exam room while observing appropriate contact time as indicated for disinfecting solutions.   I'm seeing this patient by the request  of:  Deland Pretty, MD  CC: Neck pain follow-up  EUM:[id number]  11/19/2020 Continuing to have radicular symptoms noted on the upper extremity.  Continues to not have any significant pain but patient does still have the increasing radicular symptoms with any type of extension of the neck.  Patient's x-rays show the patient did have mild degenerative changes mostly with some foraminal narrowing at the C5-C6 level bilaterally but does seem to be right greater than left.  Due to the longevity of this and patient not responding to conservative therapy I do feel that advanced imaging with an MRI is necessary at this time.  Depending on findings we may discuss the possibility of injections or possibly nerve conduction studies.  Laboratory work-up including thyroid levels will be obtained today.  Patient did have the ultrasound of the thyroid but no large masses noted that needed any type of further care.  Follow-up after imaging to discuss  Update 02/11/2021 Lisa West is a 50 y.o. female coming in with complaint of neck pain. Epidural on 12/29/2020. Patient states that her pain is gone as of today. Took 3 weeks for improvement.    Patient's MRI of the cervical spine shows the patient did have arthritic changes of multiple levels of the neck with foraminal stenosis most severe at right C6-C7 and left C5-C6.  This was independently visualized by me.  Patient then undergone a epidural on December 29, 2020.  States that she is feeling 98% better overall.   Patient is not taking any medication at this time.  Past Medical History:  Diagnosis Date   Complication of anesthesia    hard to wake up   History of kidney stones    Kidney stone 03/2009   Migraine    Past Surgical History:  Procedure Laterality Date   ABDOMINAL HYSTERECTOMY Bilateral 05/07/2019   Procedure: HYSTERECTOMY ABDOMINAL with salpingectomy;  Surgeon: Molli Posey, MD;  Location: [facility];  Service: Gynecology;  Laterality: Bilateral;  need bed   CESAREAN SECTION  2002   CYSTOSCOPY  2010   w/stent   DILATION AND CURETTAGE OF UTERUS     DILATION AND EVACUATION     X 2   TONSILLECTOMY  1982   Social History   Socioeconomic History   Marital status: Married    Spouse name: Not on file   Number of children: 3   Years of education: Not on file   Highest education level: Not on file  Occupational History   Occupation: CRNA  Tobacco Use   Smoking status: Never   Smokeless tobacco: Never  Vaping Use   Vaping Use: Never used  Substance and Sexual Activity   Alcohol use: Yes    Alcohol/week: 4.0 standard drinks    Types: 4 Glasses of wine per week   Drug use: Never   Sexual activity: Not on file  Other Topics Concern   Not on file  Social History Narrative   Lives with husband   Social Determinants of  Health   Financial Resource Strain: Not on file  Food Insecurity: Not on file  Transportation Needs: Not on file  Physical Activity: Not on file  Stress: Not on file  Social Connections: Not on file   No Known Allergies Family History  Problem Relation Age of Onset   Kidney disease Maternal Grandmother        kidney stones that cause her to lose one of her kindeys    Breast cancer Paternal Grandmother 48   Colon cancer Neg Hx    Esophageal cancer Neg Hx    Rectal cancer Neg Hx    Stomach cancer Neg Hx        Current Outpatient Medications (Analgesics):    Ubrogepant  100 MG TABS, TAKE 1 TABLET BY MOUTH. MAY TAKE SECOND DOSE AT LEAST 2 HOURS AFTER FIRST DOSE AS NEEDED ONCE A DAY   UBRELVY 100 MG TABS,    Ubrogepant 100 MG TABS, TAKE 1 TABLET BY MOUTH. MAY TAKE SECOND DOSE AT LEAST 2 HOURS AFTER FIRST DOSE AS NEEDED   Current Outpatient Medications (Other):    COVID-19 At Home Antigen Test [facility] COVID-19 HOME TEST) KIT, use as directed   gabapentin (NEURONTIN) 100 MG capsule, Take 2 capsules (200 mg total) by mouth at bedtime.   Reviewed prior external information including notes and imaging from  primary care provider As well as notes that were available from care everywhere and other healthcare systems.  Past medical history, social, surgical and family history all reviewed in electronic medical record.  No pertanent information unless stated regarding to the chief complaint.   Review of Systems:  No headache, visual changes, nausea, vomiting, diarrhea, constipation, dizziness, abdominal pain, skin rash, fevers, chills, night sweats, weight loss, swollen lymph nodes, body aches, joint swelling, chest pain, shortness of breath, mood changes. POSITIVE muscle aches  Objective  Blood pressure 122/74, pulse 72, height 5' 2"  (1.575 m), weight 126 lb (57.2 kg), last menstrual period 05/03/2019, SpO2 99 %.   General: No apparent distress alert and oriented x3 mood and affect normal, dressed appropriately.  HEENT: Pupils equal, extraocular movements intact  Respiratory: Patient's speak in full sentences and does not appear short of breath  Cardiovascular: No lower extremity edema, non tender, no erythema  Gait normal with good balance and coordination.  MSK:  Non tender with full range of motion and good stability and symmetric strength and tone of shoulders, elbows, wrist, hip, knee and ankles bilaterally.  Neck exam shows the patient is doing significantly better with no significant loss of lordosis.  Range of motion.  No significant radicular symptoms  with extension of the neck.  Much more comfortable sitting in chair.   Impression and Recommendations:     The above documentation has been reviewed and is accurate and complete Lyndal Pulley, DO

## 2021-02-11 ENCOUNTER — Ambulatory Visit (INDEPENDENT_AMBULATORY_CARE_PROVIDER_SITE_OTHER): Payer: 59 | Admitting: Family Medicine

## 2021-02-11 ENCOUNTER — Other Ambulatory Visit: Payer: Self-pay

## 2021-02-11 DIAGNOSIS — M5010 Cervical disc disorder with radiculopathy, unspecified cervical region: Secondary | ICD-10-CM

## 2021-02-11 NOTE — Assessment & Plan Note (Signed)
Patient responded significantly better to the epidural at this point.  He is feeling 98% better.  Patient has been able to increase her activity.  As long as patient does well will follow-up with me as needed.

## 2021-02-20 DIAGNOSIS — G4733 Obstructive sleep apnea (adult) (pediatric): Secondary | ICD-10-CM | POA: Diagnosis not present

## 2021-03-11 ENCOUNTER — Encounter: Payer: Self-pay | Admitting: Neurology

## 2021-03-11 ENCOUNTER — Ambulatory Visit (INDEPENDENT_AMBULATORY_CARE_PROVIDER_SITE_OTHER): Payer: 59 | Admitting: Neurology

## 2021-03-11 ENCOUNTER — Other Ambulatory Visit (HOSPITAL_COMMUNITY): Payer: Self-pay

## 2021-03-11 ENCOUNTER — Other Ambulatory Visit: Payer: Self-pay

## 2021-03-11 VITALS — BP 113/72 | HR 67 | Ht 61.0 in | Wt 124.2 lb

## 2021-03-11 DIAGNOSIS — Z9989 Dependence on other enabling machines and devices: Secondary | ICD-10-CM | POA: Diagnosis not present

## 2021-03-11 DIAGNOSIS — R519 Headache, unspecified: Secondary | ICD-10-CM | POA: Diagnosis not present

## 2021-03-11 DIAGNOSIS — G4733 Obstructive sleep apnea (adult) (pediatric): Secondary | ICD-10-CM | POA: Diagnosis not present

## 2021-03-11 DIAGNOSIS — G43009 Migraine without aura, not intractable, without status migrainosus: Secondary | ICD-10-CM

## 2021-03-11 DIAGNOSIS — G479 Sleep disorder, unspecified: Secondary | ICD-10-CM | POA: Diagnosis not present

## 2021-03-11 DIAGNOSIS — Z0100 Encounter for examination of eyes and vision without abnormal findings: Secondary | ICD-10-CM | POA: Diagnosis not present

## 2021-03-11 MED ORDER — UBRELVY 100 MG PO TABS
100.0000 mg | ORAL_TABLET | ORAL | 5 refills | Status: DC | PRN
  Filled 2021-03-11: qty 16, 30d supply, fill #0

## 2021-03-11 NOTE — Progress Notes (Addendum)
Subjective:    Patient ID: Lisa West is a 50 y.o. female.  HPI    Interim history:   Lisa West is a 50 year old with a benign medical history, who presents for follow-up consultation of her recurrent headaches.  The patient is unaccompanied today.  I first met her at the request of her primary care physician on 09/07/2020, at which time she reported an approximately 2-year history of recurrent headaches, no prior migraines, history of sinus congestion and eustachian tube dysfunction.  She was on Ubrelvy as needed, we mutually agreed not to start a headache preventative medication and pursue further work-up.  She had some symptoms concerning for sleep apnea.  She had nocturnal headaches.  She was advised to proceed with a brain MRI as well as a sleep study.  She had a brain MRI with and without contrast on 09/15/2020 and I reviewed the results:     IMPRESSION:    Normal MRI brain (with and without).  She was advised of the test results by phone call.  She had a home sleep test on 09/30/2020 which indicated moderate obstructive sleep apnea by number of events with a total AHI of 17.6/h, O2 nadir 91%.  She was encouraged to start a trial of AutoPap therapy to see if she had improvement in her symptoms.  She has a Resvent machine, start date was 01/20/2021 or thereabouts per patient.  Today, 03/11/2021: I was not able to review her AutoPap compliance download as we could not get access to the card reader on her machine.  She brought her machine and we will try to get in touch with her DME company to get access to the report.  She reports that she had a really hard time in the first 2 weeks.  She practically could not sleep with the mask, she tried 2 different masks before she settled on a ResMed fullface mask.  She is able to tolerate this and for the past few weeks she is trying to be consistent with her usage, she reports that she used her machine every night except for the last 2 nights, she came  down with symptoms of laryngitis and nasal congestion.  She still has a raspy voice and congestion.  She did have a migraine in late August and then on 02/27/2021.  Both times she did not end up taking Ubrelvy.  She was on vacation in [place], denies any triggers such as stress or dehydration.  She had not had any headache in between since February 2022.  She feels much improved with regards to her radiculopathy.  She started having radiating neck pain to the right in March 2022 and started seeing Dr. Hulan Saas for neck pain with radiculopathy.  She had a cervical spine MRI without contrast on 12/12/2020 and I reviewed the results: IMPRESSION: 1. Multilevel spondylosis of the cervical spine as described. 2. Moderate left and mild right foraminal stenosis at C3-4. 3. Moderate left foraminal narrowing at C4-5. 4. Severe left and moderate right foraminal narrowing at C5-6. 5. Moderate to severe right foraminal stenosis at C6-7.   She subsequently had an epidural steroid injection into the lower cervical spine at [place] in early July 2022 and reported great improvement during her follow-up appointment with Dr. Tamala Julian in August 2022.  Addendum, 03/11/2021, 5:12 PM: I received her AutoPap compliance data.  Set up date was 01/20/2021, I was able to review her compliance from 01/20/2021 through 03/10/2021, which is a total of 50 days,  during which time she used her machine every night except for 1 night, percent use days greater than 4 hours at 86%, indicating very good compliance, average usage for days on treatment of 5.9 hours, residual AHI at goal at 3.6/h, pressure for the 95th percentile at 6.9 cm, maximum pressure 9.6 cm, range of 5 to 11 cm, leak on the low side with a 95th percentile at 2.9 L/min.  The patient's allergies, current medications, family history, past medical history, past social history, past surgical history and problem list were reviewed and updated as appropriate.   Previously:    09/07/20: (She) reports recurrent headaches for the past 2 years, first time she had a headache was in January 2020.  She had a preceding illness as far she recalls which was in September 2019, this was a severe chest cold and she was on Mucinex for several weeks.  Headaches were more frequent in the past, occurring about once every 2 to 3 weeks.  She describes the headache as a throbbing and stabbing sensation which wakes her up in the middle of the night, typically around 2 AM.  Headache lasts for about 12 hours.  It starts in the center of her forehead typically.  She thought it was sinus related and sought consultation with ENT.  She does have nasal congestion and postnasal drip as well as muffled hearing.  She tried Zyrtec but it exacerbated the headaches.  She is now on nasal spray, Flonase.   She does not have a prior history of migraines.  She does not have aura.  She has had her eyes examined, she goes every year.  She has prescription eyeglasses/contact lenses. She denies any sudden onset of one-sided weakness or numbness or tingling or droopy face or slurring of speech.  She does not typically have any nausea or vomiting except for once when she had nausea.   I reviewed your office note from 07/02/2020.  She was started on Ubrelvy as needed.  She had an interim consultation with Dr. Jerrell Belfast in ENT on 08/11/2020 and was found to have deviated septum, inferior turbinate hypertrophy and eustachian tube dysfunction but no acute sinusitis.  She he had had a maxillofacial CT without contrast on 07/16/2019 and I reviewed the results: IMPRESSION: Normally aerated paranasal sinuses.  Patent sinus drainage pathways.   She had blood work through your office on 06/23/2020 and I reviewed the results: Lipid panel was benign, total cholesterol 131, triglycerides 32, HDL 70, LDL 52.  CMP showed BUN of 12, creatinine of 0.63, and benign results otherwise.  CBC with differential was benign, WBC 6.4, RBC 4.28,  hemoglobin 12.8, hematocrit 39.2.   She drinks caffeine in limitation, 1 serving per day, she drinks alcohol very infrequently, has not had any in months.  She is a non-smoker.  She is married and lives with her husband.  She has 3 grown children, 2 daughters, 1 son, 1 daughter with migraines.  Patient works as a Music therapist for [employer].   She has not found any telltale triggers.  She has not had any increased amount of stress, she hydrates well.  She does not always achieve a whole lot of sleep, averages maybe 6 hours.  She goes to bed between 10 and 11 and rise time is around 5:30 AM.    Her Past Medical History Is Significant For: Past Medical History:  Diagnosis Date   Complication of anesthesia    hard to wake up   History of  kidney stones    Kidney stone 03/2009   Migraine     Her Past Surgical History Is Significant For: Past Surgical History:  Procedure Laterality Date   ABDOMINAL HYSTERECTOMY Bilateral 05/07/2019   Procedure: HYSTERECTOMY ABDOMINAL with salpingectomy;  Surgeon: Molli Posey, MD;  Location: [facility];  Service: Gynecology;  Laterality: Bilateral;  need bed   CESAREAN SECTION  2002   CYSTOSCOPY  2010   w/stent   DILATION AND CURETTAGE OF UTERUS     DILATION AND EVACUATION     X 2   TONSILLECTOMY  1982    Her Family History Is Significant For: Family History  Problem Relation Age of Onset   Kidney disease Maternal Grandmother        kidney stones that cause her to lose one of her kindeys    Breast cancer Paternal Grandmother 57   Colon cancer Neg Hx    Esophageal cancer Neg Hx    Rectal cancer Neg Hx    Stomach cancer Neg Hx     Her Social History Is Significant For: Social History   Socioeconomic History   Marital status: Married    Spouse name: Not on file   Number of children: 3   Years of education: Not on file   Highest education level: Not on file  Occupational History   Occupation: CRNA  Tobacco Use   Smoking  status: Never   Smokeless tobacco: Never  Vaping Use   Vaping Use: Never used  Substance and Sexual Activity   Alcohol use: Yes    Alcohol/week: 2.0 standard drinks    Types: 2 Glasses of wine per week   Drug use: Never   Sexual activity: Not on file  Other Topics Concern   Not on file  Social History Narrative   Lives with husband   Social Determinants of Health   Financial Resource Strain: Not on file  Food Insecurity: Not on file  Transportation Needs: Not on file  Physical Activity: Not on file  Stress: Not on file  Social Connections: Not on file    Her Allergies Are:  No Known Allergies:   Her Current Medications Are:  Outpatient Encounter Medications as of 03/11/2021  Medication Sig   Ubrogepant 100 MG TABS TAKE 1 TABLET BY MOUTH. MAY TAKE SECOND DOSE AT LEAST 2 HOURS AFTER FIRST DOSE AS NEEDED ONCE A DAY   COVID-19 At Home Antigen Test ([facility] COVID-19 HOME TEST) KIT use as directed   gabapentin (NEURONTIN) 100 MG capsule Take 2 capsules (200 mg total) by mouth at bedtime.   UBRELVY 100 MG TABS    Ubrogepant 100 MG TABS TAKE 1 TABLET BY MOUTH. MAY TAKE SECOND DOSE AT LEAST 2 HOURS AFTER FIRST DOSE AS NEEDED   No facility-administered encounter medications on file as of 03/11/2021.  :  Review of Systems:  Out of a complete 14 point review of systems, all are reviewed and negative with the exception of these symptoms as listed below:  Review of Systems  Neurological:        Pt is here for follow up visit  with CPAP . Patient states the couple months were not good because of face mask . She changed 3 times and finally found one that work .Pt states that she had 2 headaches in the 2 months   Objective:  Neurological Exam  Physical Exam Physical Examination:   Vitals:   03/11/21 0733  BP: 113/72  Pulse: 67  General Examination: The patient is a very pleasant 50 y.o. female in no acute distress. She appears well-developed and well-nourished and well  groomed.   HEENT: Normocephalic, atraumatic, pupils are equal, round and reactive to light, tracking well-preserved, hearing grossly intact.  Face is symmetric with normal facial animation, speech is clear but she does have a congested and mildly raspy voice.  Airway examination reveals mild throat erythema, no swelling, tongue protrudes centrally and palate elevates symmetrically, with mild mouth dryness noted.  Neck is supple, good range of motion, no radiating pain.  No carotid bruits.  Chest: Clear to auscultation without wheezing, rhonchi or crackles noted.   Heart: S1+S2+0, regular and normal without murmurs, rubs or gallops noted.    Abdomen: Soft, non-tender and non-distended.   Extremities: There is no obvious edema in the distal lower extremities bilaterally.   Skin: Warm and dry without trophic changes noted.    Musculoskeletal: exam reveals no obvious joint deformities.    Neurologically:  Mental status: The patient is awake, alert and oriented in all 4 spheres. Her immediate and remote memory, attention, language skills and fund of knowledge are appropriate. There is no evidence of aphasia, agnosia, apraxia or anomia. Speech is clear with normal prosody and enunciation. Thought process is linear. Mood is normal and affect is normal.  Cranial nerves II - XII are as described above under HEENT exam.  Motor exam: Normal bulk, strength and tone is noted. There is no obvious tremor, Romberg negative.  Reflexes are 1-2+ throughout.  Fine motor skills and coordination grossly intact.   Cerebellar testing: No dysmetria or intention. There is no truncal or gait ataxia.  Sensory exam: intact to light touch in the upper and lower extremities.  Gait, station and balance: She stands easily. No veering to one side is noted. No leaning to one side is noted. Posture is age-appropriate and stance is narrow based. Gait shows normal stride length and normal pace. No problems turning are noted. Tandem  walk is unremarkable.    Assessment and plan:    In summary, VALEREE LEIDY is a very pleasant 50 year old female with a history of cervical radiculopathy, and otherwise benign medical history, who presents for follow-up consultation of her recurrent migrainous headaches since 2019.  She has continued to have a nonfocal exam, radiculopathy symptoms have improved since she had an epidural injection under Dr. Hulan Saas.  She could have multifactorial triggers, including cervicogenic headaches, nocturnal headaches due to underlying sleep apnea, and sinus or seasonal allergy related headaches.  Nevertheless, she did have improvement with Ubrelvy in the recent past.  She has not been able to fill it due to insurance denial but should be able to fill it using the co-pay card.  I provided a new prescription for Ubrelvy 100 mg strength use as needed with the option of taking a second pill after 2 hours if the need arises.  She is advised of her benign brain MRI results.  She has overall mild obstructive sleep apnea based on a home sleep test from 09/30/2020.  She has established AutoPap therapy and has been able to be compliant in the past several weeks.  She had a difficult time adjusting to the treatment in the first 2 weeks at least.  She is advised that I will review her compliance report and give her feedback.  She is motivated to continue with treatment, has not noticed any telltale benefit yet but is still adjusting to it.  She is  advised to follow-up routinely in this clinic in about a year, sooner if needed.  I answered all her questions today and she was in agreement. I spent 30 minutes in total face-to-face time and in reviewing records during pre-charting, more than 50% of which was spent in counseling and coordination of care, reviewing test results, reviewing medications and treatment regimen and/or in discussing or reviewing the diagnosis of migraine, OSA, the prognosis and treatment options. Pertinent  laboratory and imaging test results that were available during this visit with the patient were reviewed by me and considered in my medical decision making (see chart for details).

## 2021-03-11 NOTE — Patient Instructions (Addendum)
I am glad to hear that you are feeling better with regards to your headaches although you did have 2 fairly recent headaches.  You should be able to use the [place] co-pay card and fill the prescription.,  I renewed your prescription.  I will review your AutoPap report once we get access to it.  We will call you soon so you get your card reader back.    Please continue using your autoPAP regularly. While your insurance requires that you use PAP at least 4 hours each night on 70% of the nights, I recommend, that you not skip any nights and use it throughout the night if you can. Getting used to PAP and staying with the treatment long term does take time and patience and discipline. Untreated obstructive sleep apnea when it is moderate to severe can have an adverse impact on cardiovascular health and raise her risk for heart disease, arrhythmias, hypertension, congestive heart failure, stroke and diabetes. Untreated obstructive sleep apnea causes sleep disruption, nonrestorative sleep, and sleep deprivation. This can have an impact on your day to day functioning and cause daytime sleepiness and impairment of cognitive function, memory loss, mood disturbance, and problems focussing. Using PAP regularly can improve these symptoms.  Please follow-up routinely in one year, sooner if needed.

## 2021-03-17 ENCOUNTER — Telehealth: Payer: Self-pay | Admitting: *Deleted

## 2021-03-17 NOTE — Telephone Encounter (Signed)
Completed Roselyn Meier PA on Cover My Meds. Key: [id number]. Awaiting determination from [place].

## 2021-03-23 DIAGNOSIS — G4733 Obstructive sleep apnea (adult) (pediatric): Secondary | ICD-10-CM | POA: Diagnosis not present

## 2021-03-23 NOTE — Telephone Encounter (Signed)
Spoke with [employer]. PA still in process. They received additional info on 03/19/21 and turnaround time is 48-78 business hours.

## 2021-03-25 ENCOUNTER — Other Ambulatory Visit (HOSPITAL_COMMUNITY): Payer: Self-pay

## 2021-03-29 NOTE — Telephone Encounter (Signed)
Resent PA for Ubrevly LVM for patient to continue to use savings card until we get approval from insurance

## 2021-04-07 ENCOUNTER — Ambulatory Visit: Payer: 59 | Attending: Internal Medicine

## 2021-04-07 DIAGNOSIS — Z23 Encounter for immunization: Secondary | ICD-10-CM

## 2021-04-07 NOTE — Progress Notes (Signed)
   Covid-19 Vaccination Clinic  Name:  Lisa West    MRN: [id number] DOB: 1970/08/23  04/07/2021  Lisa West was observed post Covid-19 immunization for 15 minutes without incident. She was provided with Vaccine Information Sheet and instruction to access the V-Safe system.   Lisa West was instructed to call 911 with any severe reactions post vaccine: Difficulty breathing  Swelling of face and throat  A fast heartbeat  A bad rash all over body  Dizziness and weakness

## 2021-04-22 DIAGNOSIS — G4733 Obstructive sleep apnea (adult) (pediatric): Secondary | ICD-10-CM | POA: Diagnosis not present

## 2021-04-26 ENCOUNTER — Other Ambulatory Visit (HOSPITAL_BASED_OUTPATIENT_CLINIC_OR_DEPARTMENT_OTHER): Payer: Self-pay

## 2021-04-26 MED ORDER — COVID-19MRNA BIVAL VACC PFIZER 30 MCG/0.3ML IM SUSP
INTRAMUSCULAR | 0 refills | Status: AC
  Filled 2021-04-26: qty 0.3, 1d supply, fill #0

## 2021-05-10 DIAGNOSIS — G4733 Obstructive sleep apnea (adult) (pediatric): Secondary | ICD-10-CM | POA: Diagnosis not present

## 2021-07-08 DIAGNOSIS — Z0000 Encounter for general adult medical examination without abnormal findings: Secondary | ICD-10-CM | POA: Diagnosis not present

## 2021-07-12 DIAGNOSIS — M47812 Spondylosis without myelopathy or radiculopathy, cervical region: Secondary | ICD-10-CM | POA: Diagnosis not present

## 2021-07-12 DIAGNOSIS — G4733 Obstructive sleep apnea (adult) (pediatric): Secondary | ICD-10-CM | POA: Diagnosis not present

## 2021-07-12 DIAGNOSIS — Z0000 Encounter for general adult medical examination without abnormal findings: Secondary | ICD-10-CM | POA: Diagnosis not present

## 2021-07-12 DIAGNOSIS — G43909 Migraine, unspecified, not intractable, without status migrainosus: Secondary | ICD-10-CM | POA: Diagnosis not present

## 2021-07-14 ENCOUNTER — Other Ambulatory Visit: Payer: Self-pay | Admitting: Internal Medicine

## 2021-07-14 DIAGNOSIS — Z0000 Encounter for general adult medical examination without abnormal findings: Secondary | ICD-10-CM

## 2021-08-18 DIAGNOSIS — G4733 Obstructive sleep apnea (adult) (pediatric): Secondary | ICD-10-CM | POA: Diagnosis not present

## 2021-08-27 ENCOUNTER — Ambulatory Visit
Admission: RE | Admit: 2021-08-27 | Discharge: 2021-08-27 | Disposition: A | Discharge status: Home / Self Care | Payer: No Typology Code available for payment source | Source: Ambulatory Visit | Attending: Internal Medicine | Admitting: Internal Medicine

## 2021-08-27 DIAGNOSIS — Z0000 Encounter for general adult medical examination without abnormal findings: Secondary | ICD-10-CM

## 2021-08-27 IMAGING — CT CT CARDIAC CORONARY ARTERY CALCIUM SCORE
2 of 3 series · 10 of 20 positions shown, 12 images · non-contrast
Comparison: None.

CLINICAL DATA: 50-year-old Caucasian female for screening for
calcified coronary artery plaque.

EXAM:
CT CARDIAC CORONARY ARTERY CALCIUM SCORE
TECHNIQUE: Non-contrast imaging through the heart was performed using
prospective ECG gating. Image post processing was performed on an
independent workstation, allowing for quantitative analysis of the
heart and coronary arteries. Note that this exam targets the heart
and the chest was not imaged in its entirety.

[Series 3: calcium scoring 2.00 br40 bestdiast 68% axial · axial · 0.44mm/px · z∈[+1435,+1539]mm · 5 of 80 slices shown, 7 images]
[im 14/80  vessel]
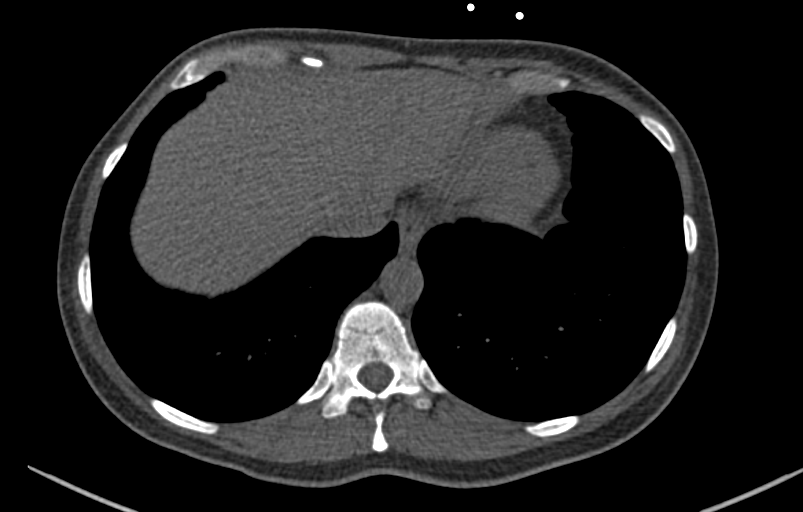
[im 14/80  lung]
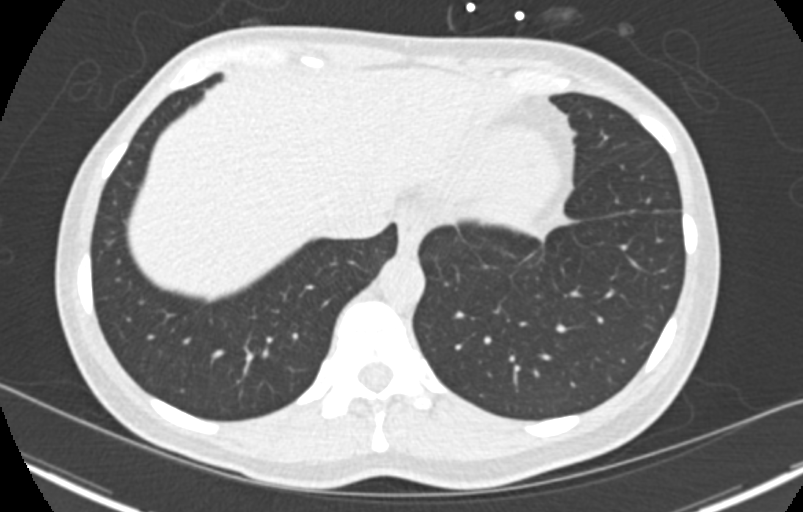
[im 27/80  vessel]
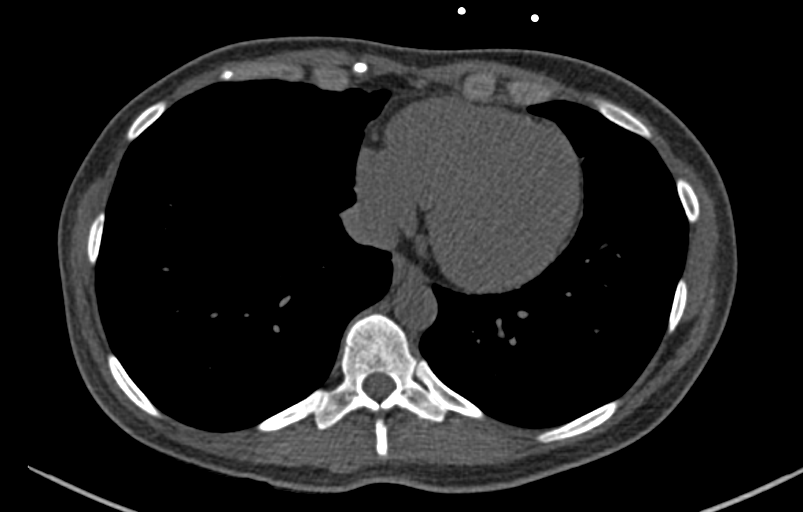
[im 40/80  vessel]
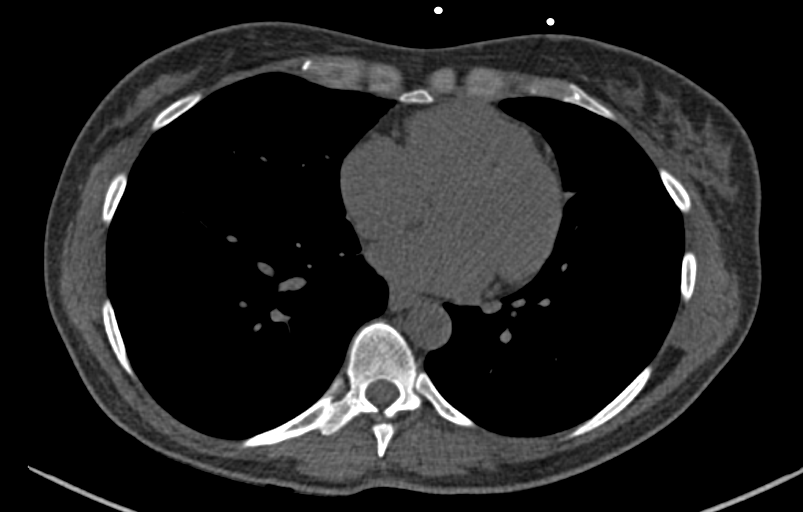
[im 53/80  vessel]
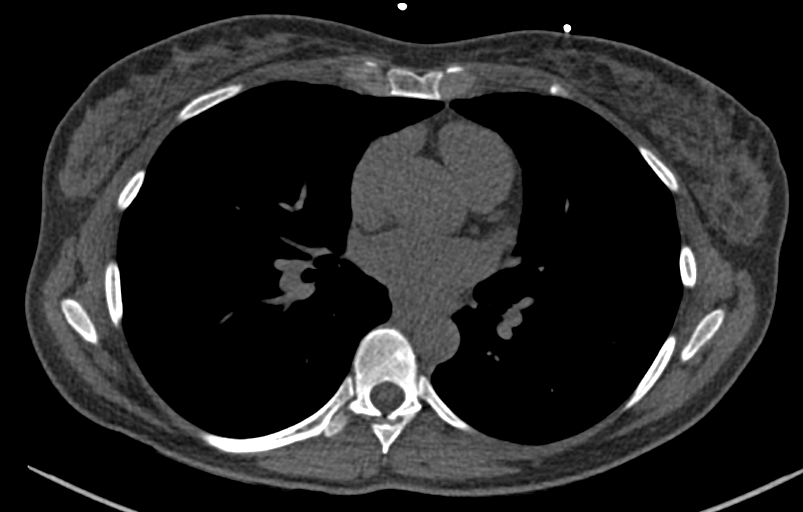
[im 66/80  vessel]
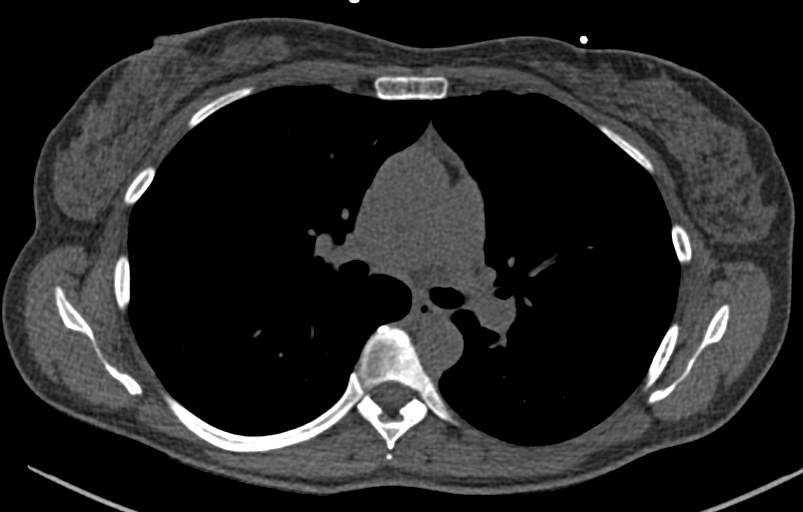
[im 66/80  lung]
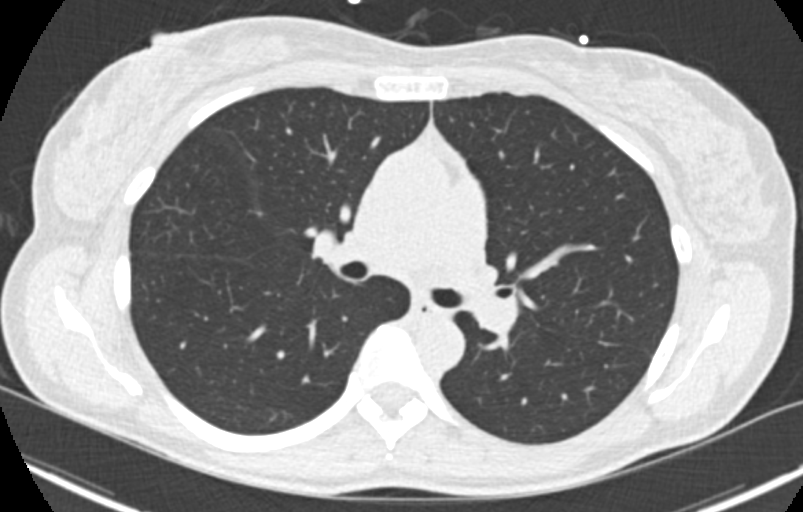

[Series 9: calcium scoring 2.00 br60 bestdiast 68% lungs · axial · 0.46mm/px · z∈[+1435,+1539]mm · 5 of 80 slices shown]
[im 14/80  vessel]
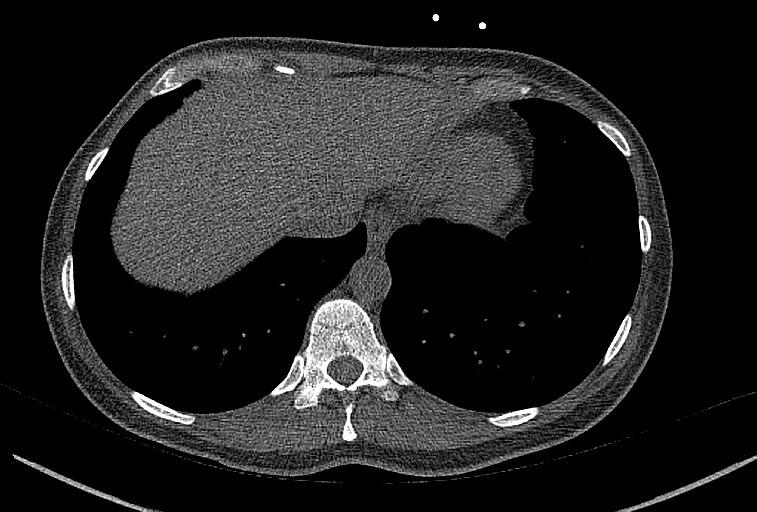
[im 27/80  vessel]
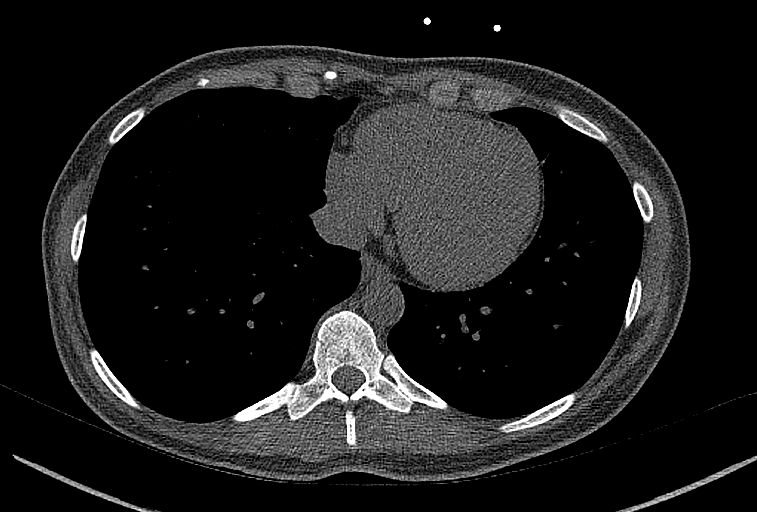
[im 40/80  vessel]
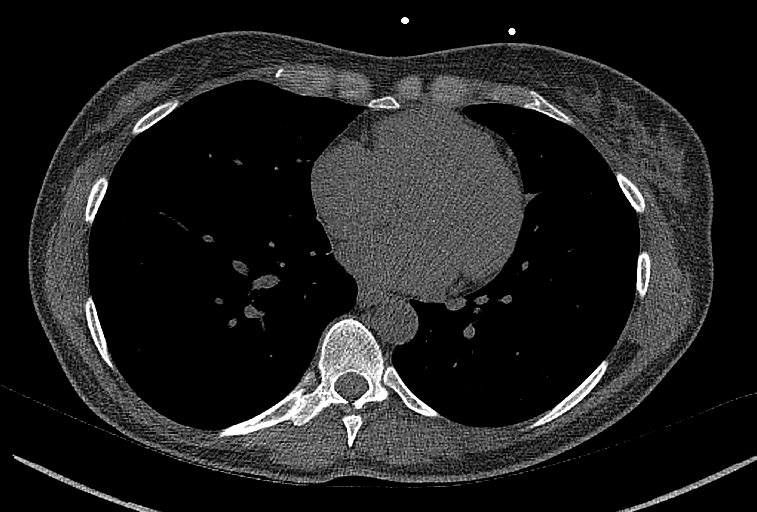
[im 53/80  vessel]
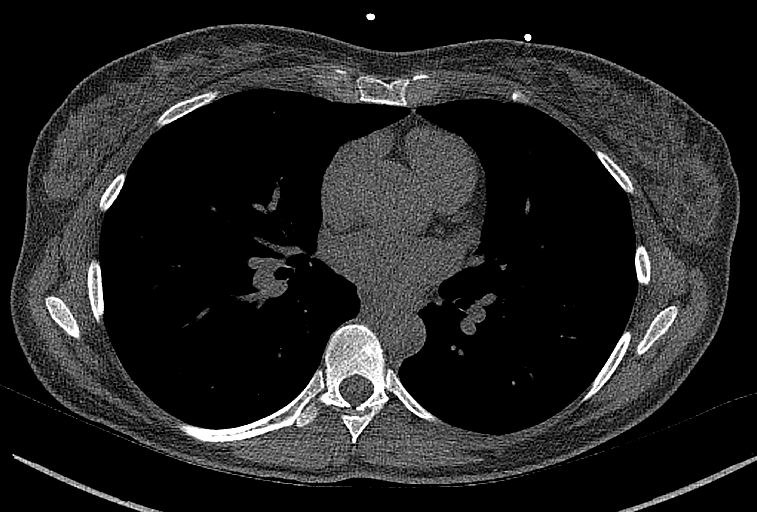
[im 66/80  vessel]
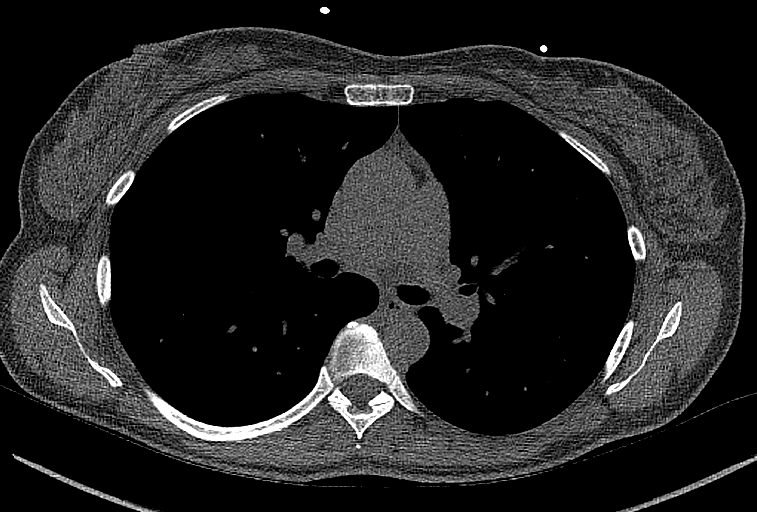

[10 of 20 positions shown; findings below may reference images not displayed]

FINDINGS: CORONARY CALCIUM SCORES:

Left Main: 0

LAD: 0

LCx: 0

RCA: 0

Total Agatston Score: 0

[HOSPITAL] percentile: 0

AORTA MEASUREMENTS:

Ascending Aorta: 31 mm

Descending Aorta: 20 mm

OTHER FINDINGS:


## 2021-10-12 DIAGNOSIS — G43009 Migraine without aura, not intractable, without status migrainosus: Secondary | ICD-10-CM | POA: Diagnosis not present

## 2021-11-18 DIAGNOSIS — Z01419 Encounter for gynecological examination (general) (routine) without abnormal findings: Secondary | ICD-10-CM | POA: Diagnosis not present

## 2021-11-18 DIAGNOSIS — Z1231 Encounter for screening mammogram for malignant neoplasm of breast: Secondary | ICD-10-CM | POA: Diagnosis not present

## 2021-11-18 DIAGNOSIS — Z6823 Body mass index (BMI) 23.0-23.9, adult: Secondary | ICD-10-CM | POA: Diagnosis not present

## 2022-02-17 DIAGNOSIS — H524 Presbyopia: Secondary | ICD-10-CM | POA: Diagnosis not present

## 2022-03-17 ENCOUNTER — Ambulatory Visit: Payer: 59 | Admitting: Neurology

## 2022-03-25 DIAGNOSIS — G4733 Obstructive sleep apnea (adult) (pediatric): Secondary | ICD-10-CM | POA: Diagnosis not present

## 2022-04-18 ENCOUNTER — Ambulatory Visit (INDEPENDENT_AMBULATORY_CARE_PROVIDER_SITE_OTHER): Payer: 59 | Admitting: Neurology

## 2022-04-18 ENCOUNTER — Telehealth: Payer: Self-pay

## 2022-04-18 ENCOUNTER — Other Ambulatory Visit (HOSPITAL_COMMUNITY): Payer: Self-pay

## 2022-04-18 ENCOUNTER — Encounter: Payer: Self-pay | Admitting: Neurology

## 2022-04-18 VITALS — BP 95/63 | HR 75 | Ht 62.0 in | Wt 122.6 lb

## 2022-04-18 DIAGNOSIS — G4733 Obstructive sleep apnea (adult) (pediatric): Secondary | ICD-10-CM | POA: Diagnosis not present

## 2022-04-18 DIAGNOSIS — G43009 Migraine without aura, not intractable, without status migrainosus: Secondary | ICD-10-CM

## 2022-04-18 MED ORDER — UBRELVY 100 MG PO TABS
100.0000 mg | ORAL_TABLET | ORAL | 5 refills | Status: DC | PRN
  Filled 2022-04-18: qty 16, 30d supply, fill #0

## 2022-04-18 NOTE — Patient Instructions (Signed)
Please continue using your autoPAP regularly. While your insurance requires that you use PAP at least 4 hours each night on 70% of the nights, I recommend, that you not skip any nights and use it throughout the night if you can. Getting used to PAP and staying with the treatment long term does take time and patience and discipline. Untreated obstructive sleep apnea when it is moderate to severe can have an adverse impact on cardiovascular health and raise her risk for heart disease, arrhythmias, hypertension, congestive heart failure, stroke and diabetes. Untreated obstructive sleep apnea causes sleep disruption, nonrestorative sleep, and sleep deprivation. This can have an impact on your day to day functioning and cause daytime sleepiness and impairment of cognitive function, memory loss, mood disturbance, and problems focussing. Using PAP regularly can improve these symptoms.  Please take your PAP machine in to your DME provider for troubleshooting.  You may need a replacement machine which I would be happy to prescribe.  Please follow-up routinely in 1 year.  I reviewed your Ubrelvy prescription.  You can see one of our nurse practitioners in a video visit.

## 2022-04-18 NOTE — Progress Notes (Signed)
Subjective:    Patient ID: Lisa West is a 51 y.o. female.  HPI    Interim history:   Lisa West is a 51 year old with a benign medical history, who presents for follow-up consultation of her recurrent headaches and OSA on autoPAP.  The patient is unaccompanied today.  I last saw her on 03/11/2021, at which time Lisa West reported tolerating AutoPap therapy.  Lisa West had a couple of migraine attacks.  Lisa West did not end up taking Roselyn Meier which I had prescribed.  Lisa West was advised to use the co-pay card and fill the prescription for Ubrelvy to use it as needed.  Lisa West was compliant with her AutoPap.  Lisa West had received an epidural steroid injection in her spine through [facility] in July 2022 with great improvement in her neck pain for which Lisa West saw Dr. Hulan Saas.  Today, 04/18/2022: I reviewed her AutoPap compliance data from 03/18/2022 through 04/17/2022, which is a total of 31 days, during which time Lisa West used her machine 22 days with percent use days greater than 4 hours at 55%, indicating mildly suboptimal compliance with an average usage of 6 hours for days on treatment.  Residual AHI at goal at 1.4/h, 95th percentile of pressure at 5.7 cm with a range of 5 to 11 cm, leak on the low side, no central apneas.  Lisa West has a Dentist.  Lisa West will orts that Lisa West has not had any recent migraines, none since February of this year.  Lisa West has done well with her neck pain after her epidural injection.  Lisa West is doing well overall, AutoPap is a nuisance at times, sometimes, half a dozen times even it turned off in the middle of the night when Lisa West was still using it.  Lisa West has not gotten in touch with her DME company yet.  Lisa West is up-to-date with her supplies, changes her filter every month.  Other than that, Lisa West is doing well.  Lisa West admits that there have been days where Lisa West skipped it altogether.  Lisa West is motivated to continue with treatment.  Lisa West needs a renewal on her Roselyn Meier.  Lisa West has not had to use it lately  thankfully.  The patient's allergies, current medications, family history, past medical history, past social history, past surgical history and problem list were reviewed and updated as appropriate.    Previously:   Lisa West had a cervical spine MRI without contrast on 12/12/2020 and I reviewed the results: IMPRESSION: 1. Multilevel spondylosis of the cervical spine as described. 2. Moderate left and mild right foraminal stenosis at C3-4. 3. Moderate left foraminal narrowing at C4-5. 4. Severe left and moderate right foraminal narrowing at C5-6. 5. Moderate to severe right foraminal stenosis at C6-7.   Addendum, 03/11/2021, 5:12 PM: I received her AutoPap compliance data.  Set up date was 01/20/2021, I was able to review her compliance from 01/20/2021 through 03/10/2021, which is a total of 50 days, during which time Lisa West used her machine every night except for 1 night, percent use days greater than 4 hours at 86%, indicating very good compliance, average usage for days on treatment of 5.9 hours, residual AHI at goal at 3.6/h, pressure for the 95th percentile at 6.9 cm, maximum pressure 9.6 cm, range of 5 to 11 cm, leak on the low side with a 95th percentile at 2.9 L/min.    I first met her at the request of her primary care physician on 09/07/2020, at which time Lisa West reported an approximately 2-year history of recurrent headaches,  no prior migraines, history of sinus congestion and eustachian tube dysfunction.  Lisa West was on Ubrelvy as needed, we mutually agreed not to start a headache preventative medication and pursue further work-up.  Lisa West had some symptoms concerning for sleep apnea.  Lisa West had nocturnal headaches.  Lisa West was advised to proceed with a brain MRI as well as a sleep study.  Lisa West had a brain MRI with and without contrast on 09/15/2020 and I reviewed the results:     IMPRESSION:    Normal MRI brain (with and without).   Lisa West was advised of the test results by phone call.  Lisa West had a home sleep test on  09/30/2020 which indicated moderate obstructive sleep apnea by number of events with a total AHI of 17.6/h, O2 nadir 91%.  Lisa West was encouraged to start a trial of AutoPap therapy to see if Lisa West had improvement in her symptoms.  Lisa West has a Resvent machine, start date was 01/20/2021 or thereabouts per patient.      09/07/20: (Lisa West) reports recurrent headaches for the past 2 years, first time Lisa West had a headache was in January 2020.  Lisa West had a preceding illness as far Lisa West recalls which was in September 2019, this was a severe chest cold and Lisa West was on Mucinex for several weeks.  Headaches were more frequent in the past, occurring about once every 2 to 3 weeks.  Lisa West describes the headache as a throbbing and stabbing sensation which wakes her up in the middle of the night, typically around 2 AM.  Headache lasts for about 12 hours.  It starts in the center of her forehead typically.  Lisa West thought it was sinus related and sought consultation with ENT.  Lisa West does have nasal congestion and postnasal drip as well as muffled hearing.  Lisa West tried Zyrtec but it exacerbated the headaches.  Lisa West is now on nasal spray, Flonase.   Lisa West does not have a prior history of migraines.  Lisa West does not have aura.  Lisa West has had her eyes examined, Lisa West goes every year.  Lisa West has prescription eyeglasses/contact lenses. Lisa West denies any sudden onset of one-sided weakness or numbness or tingling or droopy face or slurring of speech.  Lisa West does not typically have any nausea or vomiting except for once when Lisa West had nausea.   I reviewed your office note from 07/02/2020.  Lisa West was started on Ubrelvy as needed.  Lisa West had an interim consultation with Dr. Jerrell Belfast in ENT on 08/11/2020 and was found to have deviated septum, inferior turbinate hypertrophy and eustachian tube dysfunction but no acute sinusitis.  Lisa West he had had a maxillofacial CT without contrast on 07/16/2019 and I reviewed the results: IMPRESSION: Normally aerated paranasal sinuses.  Patent sinus  drainage pathways.   Lisa West had blood work through your office on 06/23/2020 and I reviewed the results: Lipid panel was benign, total cholesterol 131, triglycerides 32, HDL 70, LDL 52.  CMP showed BUN of 12, creatinine of 0.63, and benign results otherwise.  CBC with differential was benign, WBC 6.4, RBC 4.28, hemoglobin 12.8, hematocrit 39.2.   Lisa West drinks caffeine in limitation, 1 serving per day, Lisa West drinks alcohol very infrequently, has not had any in months.  Lisa West is a non-smoker.  Lisa West is married and lives with her husband.  Lisa West has 3 grown children, 2 daughters, 1 son, 1 daughter with migraines.  Patient works as a Music therapist for [employer].   Lisa West has not found any telltale triggers.  Lisa West has not had any  increased amount of stress, Lisa West hydrates well.  Lisa West does not always achieve a whole lot of sleep, averages maybe 6 hours.  Lisa West goes to bed between 10 and 11 and rise time is around 5:30 AM.  Her Past Medical History Is Significant For: Past Medical History:  Diagnosis Date   Complication of anesthesia    hard to wake up   History of kidney stones    Kidney stone 03/2009   Migraine     Her Past Surgical History Is Significant For: Past Surgical History:  Procedure Laterality Date   ABDOMINAL HYSTERECTOMY Bilateral 05/07/2019   Procedure: HYSTERECTOMY ABDOMINAL with salpingectomy;  Surgeon: Molli Posey, MD;  Location: [facility];  Service: Gynecology;  Laterality: Bilateral;  need bed   CESAREAN SECTION  2002   CYSTOSCOPY  2010   w/stent   DILATION AND CURETTAGE OF UTERUS     DILATION AND EVACUATION     X 2   TONSILLECTOMY  1982    Her Family History Is Significant For: Family History  Problem Relation Age of Onset   Kidney disease Maternal Grandmother        kidney stones that cause her to lose one of her kindeys    Breast cancer Paternal Grandmother 46   Migraines Daughter    Colon cancer Neg Hx    Esophageal cancer Neg Hx    Rectal cancer Neg Hx     Stomach cancer Neg Hx    Sleep apnea Neg Hx     Her Social History Is Significant For: Social History   Socioeconomic History   Marital status: Married    Spouse name: Not on file   Number of children: 3   Years of education: Not on file   Highest education level: Not on file  Occupational History   Occupation: CRNA  Tobacco Use   Smoking status: Never   Smokeless tobacco: Never  Vaping Use   Vaping Use: Never used  Substance and Sexual Activity   Alcohol use: Yes    Alcohol/week: 3.0 standard drinks of alcohol    Types: 3 Glasses of wine per week   Drug use: Never   Sexual activity: Not on file  Other Topics Concern   Not on file  Social History Narrative   Lives with husband   Social Determinants of Health   Financial Resource Strain: Not on file  Food Insecurity: Not on file  Transportation Needs: Not on file  Physical Activity: Not on file  Stress: Not on file  Social Connections: Not on file    Her Allergies Are:  No Known Allergies:   Her Current Medications Are:  Outpatient Encounter Medications as of 04/18/2022  Medication Sig   COVID-19 At Home Antigen Test ([facility] COVID-19 HOME TEST) KIT use as directed   COVID-19 mRNA bivalent vaccine, Pfizer, injection Inject into the muscle.   gabapentin (NEURONTIN) 100 MG capsule Take 2 capsules (200 mg total) by mouth at bedtime.   UBRELVY 100 MG TABS    Ubrogepant (UBRELVY) 100 MG TABS Take 100 mg by mouth as needed. May repeat in 2 hours if needed, no more than 2 pills in 24 hours.   [DISCONTINUED] Ubrogepant (UBRELVY) 100 MG TABS Take 100 mg by mouth as needed. May repeat in 2 hours if needed, no more than 2 pills in 24 hours. (Patient not taking: Reported on 04/18/2022)   No facility-administered encounter medications on file as of 04/18/2022.  :  Review of Systems:  Out of a complete 14 point review of systems, all are reviewed and negative with the exception of these symptoms as listed below:  Review  of Systems  Neurological:        Pt here fir CPAP and Migraine f/u Pt has no questions ir concerns about CPAP machine . Pt states no migraines since Feb 2023     ESS:7    Objective:  Neurological Exam  Physical Exam Physical Examination:   Vitals:   04/18/22 0743  BP: 95/63  Pulse: 75    General Examination: The patient is a very pleasant 51 y.o. female in no acute distress. Lisa West appears well-developed and well-nourished and well groomed.   HEENT: Normocephalic, atraumatic, pupils are equal, round and reactive to light, tracking well-preserved, hearing grossly intact.  Face is symmetric with normal facial animation, speech is clear with no dysarthria, hypophonia or voice tremor.  Neck with full range of motion, no carotid bruits.  Airway examination reveals no significant mouth dryness, tongue protrudes centrally and palate elevates symmetrically.   Chest: Clear to auscultation without wheezing, rhonchi or crackles noted.   Heart: S1+S2+0, regular and normal without murmurs, rubs or gallops noted.    Abdomen: Soft, non-tender and non-distended.   Extremities: There is no obvious edema in the distal lower extremities bilaterally.   Skin: Warm and dry without trophic changes noted.    Musculoskeletal: exam reveals no obvious joint deformities.    Neurologically:  Mental status: The patient is awake, alert and oriented in all 4 spheres. Her immediate and remote memory, attention, language skills and fund of knowledge are appropriate. There is no evidence of aphasia, agnosia, apraxia or anomia. Speech is clear with normal prosody and enunciation. Thought process is linear. Mood is normal and affect is normal.  Cranial nerves II - XII are as described above under HEENT exam.  Motor exam: Normal bulk, moving all 4 extremities, no obvious tremor.  Fine motor skills and coordination grossly intact.   Cerebellar testing: No dysmetria or intention. There is no truncal or gait ataxia.   Sensory exam: intact to light touch in the upper and lower extremities.  Gait, station and balance: Lisa West stands easily. No veering to one side is noted. No leaning to one side is noted. Posture is age-appropriate and stance is narrow based. Gait shows normal stride length and normal pace. No problems turning are noted.    Assessment and plan:    In summary, ADRIONNA DELCID is a very pleasant 51 year old female with a history of migraine headaches, cervical radiculopathy, and sleep apnea, who presents for follow-up consultation of her migraine headaches and obstructive sleep apnea on AutoPap therapy.  Lisa West started having recurrent migraines in 2019.  Lisa West has had cervical radiculopathy, much improved after a cervical epidural steroid injection under Dr. Hulan Saas.  Lisa West was found to have moderate obstructive sleep apnea by number of events in April 2022 and is established on AutoPap therapy.  Lisa West has had infrequent migraine headaches.  Lisa West has a prescription for Roselyn Meier as needed, has not had to use it in the recent past thankfully.  Lisa West needs an updated prescription though.  Brain MRI was benign.  Lisa West is advised to be consistent with her AutoPap.  I renewed her prescription for Ubrelvy.  Lisa West is advised to take in her machine to her DME provider for thrombophilia as it turns off randomly at times.  Lisa West may need a replacement machine.  I would like a prescription improvement.  At this juncture, Lisa West is advised to follow-up routinely with [place] in 1 year, we can offer her a virtual visit through [place] which Lisa West would like.  Lisa West can see one of our nurse practitioners.  I answered all her questions today and Lisa West was in agreement.   I spent 20 minutes in total face-to-face time and in reviewing records during pre-charting, more than 50% of which was spent in counseling and coordination of care, reviewing test results, reviewing medications and treatment regimen and/or in discussing or reviewing the diagnosis of OSA,  migraine, the prognosis and treatment options. Pertinent laboratory and imaging test results that were available during this visit with the patient were reviewed by me and considered in my medical decision making (see chart for details).

## 2022-04-18 NOTE — Telephone Encounter (Signed)
PA submitted via CMM  Key: [id number] information has been sent to [place]. Awaiting determination

## 2022-05-04 NOTE — Telephone Encounter (Signed)
Contacted pt, LVM per DPR, informing her insurance denied coverage for Ubrelvy. Advised to go online to get the savings card through them to lower the cost of coverage. Number provided to call back with questions or concerns.

## 2022-05-04 NOTE — Telephone Encounter (Signed)
Deniedon October 25 This request has not been approved. Based on the information submitted for review, you did not meet our guideline rules for the requested drug. In order for your request to be approved, your provider would need to show that you have met the guideline rules below. The details below are written in medical language. If you have questions, please contact your provider. In some cases, the requested medication or alternatives offered may have additional approval requirements. Your provider requested Lisa West to manage your condition of acute (quick onset) migraine (headaches that cause intense, throbbing pain on one side or both sides of the head). For the treatment of acute migraine, our guideline named our guideline named UBROGEPANT Lisa West) requires that you have previously tried one triptan ( a class of medicine such as sumatriptan or rizatriptan), unless there is a medical reason why you cannot. This request was denied because we did not receive information that you meet the requirement listed above. Please work with your doctor to use a different medication or get [place] more information if it will allow [place] to approve this request. A written notification letter will follow with additional details.

## 2022-05-05 ENCOUNTER — Other Ambulatory Visit (HOSPITAL_COMMUNITY): Payer: Self-pay

## 2022-06-06 ENCOUNTER — Encounter: Payer: Self-pay | Admitting: Neurology

## 2022-06-06 NOTE — Telephone Encounter (Signed)
Can we work on an Astronomer, or can she use her insurance and copay card for [place]?

## 2022-06-09 ENCOUNTER — Other Ambulatory Visit (HOSPITAL_COMMUNITY): Payer: Self-pay

## 2022-06-09 MED ORDER — RIZATRIPTAN BENZOATE 5 MG PO TBDP
5.0000 mg | ORAL_TABLET | ORAL | 1 refills | Status: DC | PRN
  Filled 2022-06-09: qty 10, 30d supply, fill #0

## 2022-06-09 NOTE — Addendum Note (Signed)
Addended by: Star Age on: 06/09/2022 08:24 AM   Modules accepted: Orders

## 2022-06-09 NOTE — Telephone Encounter (Signed)
Will send Rx for Maxalt MLT 5 mg, prn.

## 2022-07-12 DIAGNOSIS — Z0000 Encounter for general adult medical examination without abnormal findings: Secondary | ICD-10-CM | POA: Diagnosis not present

## 2022-07-14 DIAGNOSIS — G4733 Obstructive sleep apnea (adult) (pediatric): Secondary | ICD-10-CM | POA: Diagnosis not present

## 2022-07-18 DIAGNOSIS — G43909 Migraine, unspecified, not intractable, without status migrainosus: Secondary | ICD-10-CM | POA: Diagnosis not present

## 2022-07-18 DIAGNOSIS — J342 Deviated nasal septum: Secondary | ICD-10-CM | POA: Diagnosis not present

## 2022-07-18 DIAGNOSIS — Z0000 Encounter for general adult medical examination without abnormal findings: Secondary | ICD-10-CM | POA: Diagnosis not present

## 2022-07-18 DIAGNOSIS — J309 Allergic rhinitis, unspecified: Secondary | ICD-10-CM | POA: Diagnosis not present

## 2022-08-14 DIAGNOSIS — G4733 Obstructive sleep apnea (adult) (pediatric): Secondary | ICD-10-CM | POA: Diagnosis not present

## 2022-09-12 DIAGNOSIS — G4733 Obstructive sleep apnea (adult) (pediatric): Secondary | ICD-10-CM | POA: Diagnosis not present

## 2022-10-20 DIAGNOSIS — M65312 Trigger thumb, left thumb: Secondary | ICD-10-CM | POA: Diagnosis not present

## 2022-11-17 DIAGNOSIS — G4733 Obstructive sleep apnea (adult) (pediatric): Secondary | ICD-10-CM | POA: Diagnosis not present

## 2022-12-18 DIAGNOSIS — G4733 Obstructive sleep apnea (adult) (pediatric): Secondary | ICD-10-CM | POA: Diagnosis not present

## 2023-01-05 DIAGNOSIS — H524 Presbyopia: Secondary | ICD-10-CM | POA: Diagnosis not present

## 2023-01-05 DIAGNOSIS — M65312 Trigger thumb, left thumb: Secondary | ICD-10-CM | POA: Diagnosis not present

## 2023-01-17 DIAGNOSIS — G4733 Obstructive sleep apnea (adult) (pediatric): Secondary | ICD-10-CM | POA: Diagnosis not present

## 2023-01-19 DIAGNOSIS — M65312 Trigger thumb, left thumb: Secondary | ICD-10-CM | POA: Diagnosis not present

## 2023-02-01 DIAGNOSIS — M65312 Trigger thumb, left thumb: Secondary | ICD-10-CM | POA: Diagnosis not present

## 2023-04-13 ENCOUNTER — Encounter: Payer: Self-pay | Admitting: *Deleted

## 2023-04-17 ENCOUNTER — Telehealth (INDEPENDENT_AMBULATORY_CARE_PROVIDER_SITE_OTHER): Payer: BC Managed Care – PPO | Admitting: Neurology

## 2023-04-17 ENCOUNTER — Other Ambulatory Visit: Payer: Self-pay | Admitting: *Deleted

## 2023-04-17 ENCOUNTER — Other Ambulatory Visit (HOSPITAL_COMMUNITY): Payer: Self-pay

## 2023-04-17 DIAGNOSIS — G4733 Obstructive sleep apnea (adult) (pediatric): Secondary | ICD-10-CM | POA: Diagnosis not present

## 2023-04-17 DIAGNOSIS — G43009 Migraine without aura, not intractable, without status migrainosus: Secondary | ICD-10-CM

## 2023-04-17 MED ORDER — UBRELVY 100 MG PO TABS
100.0000 mg | ORAL_TABLET | ORAL | 5 refills | Status: AC | PRN
  Filled 2023-04-17: qty 16, 30d supply, fill #0

## 2023-04-17 NOTE — Patient Instructions (Signed)
It was nice to see you again today.  Please continue with your AutoPap, your compliance was received and looks good, average usage just a little over 6 hours, it would be great if you can push the usage to 6 to 7 hours on average, average apnea score 2.5/h which is very good.   I would like for you to message [place] back with your new pharmacy information as no preferred pharmacy is listed.   Also, I will put a new prescription in for an AutoPap since you have new insurance but some insurances still do not accept a prescription for a new machine if it has not been over 5 years since the old machine was issued.  We will try anyway and send the prescription to [facility]. I have not yet put the prescription in for Ubrelvy.  Follow-up in 1 year, we can do a virtual visit again.

## 2023-04-17 NOTE — Progress Notes (Signed)
Interim history:   Lisa West is a 52 year old with a benign medical history, who presents for a MyChart video visit for follow-up consultation of her recurrent headaches and OSA on autoPAP.  The patient is unaccompanied today and joins via laptop from home, I am located in my office at [facility], utilizing my current laptop computer.  She presents for her 1 year checkup.  I last saw her on 04/18/2022, at which time she was mildly suboptimal with her AutoPap compliance.  She reported that she had done well with epidural neck injections with regards to neck pain and headaches.  She did not have to use Ubrelvy frequently.   Today, 04/17/2023: She reports doing well, she is compliant with her AutoPap and tolerates it.  She has had no serious or frequent migraines, had to use Ubrelvy once as she recalls and it is effective.  Her prescription will expire.  She is requesting a renewal.  She changed jobs a few months ago.  She works at [employer], Garment/textile technologist, 3 days a week.  She has a 1 hour commute but does not hit peak traffic either way and does not mind the drive.  She is without new concerns.  I reviewed her AutoPap compliance data for the past 30 days from 03/15/2023 through 04/13/2023, during which time she used her machine 100% with percent use days greater than 4 hours at 93%, indicating excellent compliance, average usage of 6.2 hours, residual AHI at goal at 2.5/h, 95th percentile pressure at 5.9 cm with a range of 5 to 11 cm with EPR of 3.  She has a Chief Executive Officer.  It does not have a modem.  She would like to see about getting a new machine with a modem.  The patient's allergies, current medications, family history, past medical history, past social history, past surgical history and problem list were reviewed and updated as appropriate.    Previously:   04/18/2022: I reviewed her AutoPap compliance data from 03/18/2022 through 04/17/2022, which is a total of 31 days,  during which time she used her machine 22 days with percent use days greater than 4 hours at 55%, indicating mildly suboptimal compliance with an average usage of 6 hours for days on treatment.  Residual AHI at goal at 1.4/h, 95th percentile of pressure at 5.7 cm with a range of 5 to 11 cm, leak on the low side, no central apneas.  She has a Web designer.  She will orts that she has not had any recent migraines, none since February of this year.  She has done well with her neck pain after her epidural injection.  She is doing well overall, AutoPap is a nuisance at times, sometimes, half a dozen times even it turned off in the middle of the night when she was still using it.  She has not gotten in touch with her DME company yet.  She is up-to-date with her supplies, changes her filter every month.  Other than that, she is doing well.  She admits that there have been days where she skipped it altogether.  She is motivated to continue with treatment.  She needs a renewal on her Bernita Raisin.  She has not had to use it lately thankfully.   I saw her on 03/11/2021, at which time she reported tolerating AutoPap therapy.  She had a couple of migraine attacks.  She did not end up taking Bernita Raisin which I had prescribed.  She was advised to use  the co-pay card and fill the prescription for Ubrelvy to use it as needed.  She was compliant with her AutoPap.  She had received an epidural steroid injection in her spine through [facility] in July 2022 with great improvement in her neck pain for which she saw Dr. Antoine Primas.      She had a cervical spine MRI without contrast on 12/12/2020 and I reviewed the results: IMPRESSION: 1. Multilevel spondylosis of the cervical spine as described. 2. Moderate left and mild right foraminal stenosis at C3-4. 3. Moderate left foraminal narrowing at C4-5. 4. Severe left and moderate right foraminal narrowing at C5-6. 5. Moderate to severe right foraminal stenosis at C6-7.    Addendum, 03/11/2021, 5:12 PM: I received her AutoPap compliance data.  Set up date was 01/20/2021, I was able to review her compliance from 01/20/2021 through 03/10/2021, which is a total of 50 days, during which time she used her machine every night except for 1 night, percent use days greater than 4 hours at 86%, indicating very good compliance, average usage for days on treatment of 5.9 hours, residual AHI at goal at 3.6/h, pressure for the 95th percentile at 6.9 cm, maximum pressure 9.6 cm, range of 5 to 11 cm, leak on the low side with a 95th percentile at 2.9 L/min.     I first met her at the request of her primary care physician on 09/07/2020, at which time she reported an approximately 2-year history of recurrent headaches, no prior migraines, history of sinus congestion and eustachian tube dysfunction.  She was on Ubrelvy as needed, we mutually agreed not to start a headache preventative medication and pursue further work-up.  She had some symptoms concerning for sleep apnea.  She had nocturnal headaches.  She was advised to proceed with a brain MRI as well as a sleep study.  She had a brain MRI with and without contrast on 09/15/2020 and I reviewed the results:     IMPRESSION:    Normal MRI brain (with and without).   She was advised of the test results by phone call.  She had a home sleep test on 09/30/2020 which indicated moderate obstructive sleep apnea by number of events with a total AHI of 17.6/h, O2 nadir 91%.  She was encouraged to start a trial of AutoPap therapy to see if she had improvement in her symptoms.  She has a Resvent machine, start date was 01/20/2021 or thereabouts per patient.     09/07/20: (She) reports recurrent headaches for the past 2 years, first time she had a headache was in January 2020.  She had a preceding illness as far she recalls which was in September 2019, this was a severe chest cold and she was on Mucinex for several weeks.  Headaches were more frequent in the  past, occurring about once every 2 to 3 weeks.  She describes the headache as a throbbing and stabbing sensation which wakes her up in the middle of the night, typically around 2 AM.  Headache lasts for about 12 hours.  It starts in the center of her forehead typically.  She thought it was sinus related and sought consultation with ENT.  She does have nasal congestion and postnasal drip as well as muffled hearing.  She tried Zyrtec but it exacerbated the headaches.  She is now on nasal spray, Flonase.   She does not have a prior history of migraines.  She does not have aura.  She has had her  eyes examined, she goes every year.  She has prescription eyeglasses/contact lenses. She denies any sudden onset of one-sided weakness or numbness or tingling or droopy face or slurring of speech.  She does not typically have any nausea or vomiting except for once when she had nausea.   I reviewed your office note from 07/02/2020.  She was started on Ubrelvy as needed.  She had an interim consultation with Dr. Osborn Coho in ENT on 08/11/2020 and was found to have deviated septum, inferior turbinate hypertrophy and eustachian tube dysfunction but no acute sinusitis.  She he had had a maxillofacial CT without contrast on 07/16/2019 and I reviewed the results: IMPRESSION: Normally aerated paranasal sinuses.  Patent sinus drainage pathways.   She had blood work through your office on 06/23/2020 and I reviewed the results: Lipid panel was benign, total cholesterol 131, triglycerides 32, HDL 70, LDL 52.  CMP showed BUN of 12, creatinine of 0.63, and benign results otherwise.  CBC with differential was benign, WBC 6.4, RBC 4.28, hemoglobin 12.8, hematocrit 39.2.   She drinks caffeine in limitation, 1 serving per day, she drinks alcohol very infrequently, has not had any in months.  She is a non-smoker.  She is married and lives with her husband.  She has 3 grown children, 2 daughters, 1 son, 1 daughter with migraines.   Patient works as a Garment/textile technologist for [employer].   She has not found any telltale triggers.  She has not had any increased amount of stress, she hydrates well.  She does not always achieve a whole lot of sleep, averages maybe 6 hours.  She goes to bed between 10 and 11 and rise time is around 5:30 AM.  Her Past Medical History Is Significant For: Past Medical History:  Diagnosis Date   Complication of anesthesia    hard to wake up   History of kidney stones    Kidney stone 03/2009   Migraine     Her Past Surgical History Is Significant For: Past Surgical History:  Procedure Laterality Date   ABDOMINAL HYSTERECTOMY Bilateral 05/07/2019   Procedure: HYSTERECTOMY ABDOMINAL with salpingectomy;  Surgeon: Richarda Overlie, MD;  Location: [facility];  Service: Gynecology;  Laterality: Bilateral;  need bed   CESAREAN SECTION  2002   CYSTOSCOPY  2010   w/stent   DILATION AND CURETTAGE OF UTERUS     DILATION AND EVACUATION     X 2   TONSILLECTOMY  1982    Her Family History Is Significant For: Family History  Problem Relation Age of Onset   Kidney disease Maternal Grandmother        kidney stones that cause her to lose one of her kindeys    Breast cancer Paternal Grandmother 67   Migraines Daughter    Colon cancer Neg Hx    Esophageal cancer Neg Hx    Rectal cancer Neg Hx    Stomach cancer Neg Hx    Sleep apnea Neg Hx     Her Social History Is Significant For: Social History   Socioeconomic History   Marital status: Married    Spouse name: Not on file   Number of children: 3   Years of education: Not on file   Highest education level: Not on file  Occupational History   Occupation: CRNA  Tobacco Use   Smoking status: Never   Smokeless tobacco: Never  Vaping Use   Vaping status: Never Used  Substance and Sexual Activity   Alcohol  use: Yes    Alcohol/week: 3.0 standard drinks of alcohol    Types: 3 Glasses of wine per week   Drug use: Never   Sexual  activity: Not on file  Other Topics Concern   Not on file  Social History Narrative   Lives with husband   Social Determinants of Health   Financial Resource Strain: Not on file  Food Insecurity: Not on file  Transportation Needs: Not on file  Physical Activity: Not on file  Stress: Not on file  Social Connections: Not on file    Her Allergies Are:  No Known Allergies:   Her Current Medications Are:  Outpatient Encounter Medications as of 04/17/2023  Medication Sig   COVID-19 At Home Antigen Test (CARESTART COVID-19 HOME TEST) KIT use as directed   COVID-19 mRNA bivalent vaccine, Pfizer, injection Inject into the muscle.   gabapentin (NEURONTIN) 100 MG capsule Take 2 capsules (200 mg total) by mouth at bedtime.   rizatriptan (MAXALT-MLT) 5 MG disintegrating tablet Take 1 tablet (5 mg total) by mouth as needed for migraine. May repeat in 2 hours as needed, no more than 2 pills/24 h, no more than 3 pills/week.   UBRELVY 100 MG TABS    Ubrogepant (UBRELVY) 100 MG TABS Take 100 mg by mouth as needed. May repeat in 2 hours if needed, no more than 2 pills in 24 hours.   No facility-administered encounter medications on file as of 04/17/2023.  :  Review of Systems:  Out of a complete 14 point review of systems, all are reviewed and negative with the exception of these symptoms as listed below:  Virtual Visit via Video Note on 04/17/2023:   I connected with De Nurse on 04/17/23 at  7:45 AM EDT by a video enabled telemedicine application and verified that I am speaking with the correct person using two identifiers.   I discussed the limitations of evaluation and management by telemedicine and the availability of in person appointments. The patient expressed understanding and agreed to proceed.  History of Present Illness: See above.   Observations/Objective:  Pleasant, conversant, no acute distress, no dysarthria, face symmetric, upper body movements normal.  Able to  converse in full sentences, no tachypnea.  Assessment and Plan:  In summary, Lisa West is a 52 year old female with a history of migraine headaches, cervical radiculopathy, and sleep apnea, who presents for follow-up consultation of her headaches and obstructive sleep apnea on AutoPap therapy.  She started having recurrent migraines in 2019.  She has had cervical radiculopathy, much improved after a cervical epidural steroid injection under Dr. Antoine Primas.  She was found to have moderate obstructive sleep apnea by number of events in April 2022 and is established on AutoPap therapy.  She has had infrequent migraine headaches.  As needed use of Bernita Raisin has been helpful.  I will update her prescription once I have new pharmacy information, I forgot to ask her about her new pharmacy during the visit.  She is compliant with her AutoPap.  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  Her brain MRI from 09/15/2020 was benign.  She is advised to continue with AutoPap therapy and use Ubrelvy as needed.  She can follow-up routinely in 1 year, we can offer her a video visit again if she prefers.  I answered all her questions today and she was in agreement.   Follow Up Instructions:  I discussed the assessment and treatment plan with the patient. The patient was provided an opportunity to ask questions and all were answered. The patient agreed with the plan and demonstrated an understanding of the instructions.   The patient was advised to call back or seek an in-person evaluation if the symptoms worsen or if the condition fails to improve as anticipated.  I provided 22 minutes of non-face-to-face time during this encounter.   Huston Foley, MD

## 2023-05-11 ENCOUNTER — Telehealth: Payer: Self-pay

## 2023-05-11 ENCOUNTER — Other Ambulatory Visit (HOSPITAL_COMMUNITY): Payer: Self-pay

## 2023-05-11 NOTE — Telephone Encounter (Signed)
*  [facility]  Pharmacy Patient Advocate Encounter  Received notification from [facility] that Prior Authorization for Ubrelvy 100MG  tablets  has been APPROVED from 05/11/2023 to 05/09/2024   PA #/Case ID/Reference #: [id number]

## 2023-07-18 DIAGNOSIS — Z0000 Encounter for general adult medical examination without abnormal findings: Secondary | ICD-10-CM | POA: Diagnosis not present

## 2023-07-27 DIAGNOSIS — G43909 Migraine, unspecified, not intractable, without status migrainosus: Secondary | ICD-10-CM | POA: Diagnosis not present

## 2023-07-27 DIAGNOSIS — Z0000 Encounter for general adult medical examination without abnormal findings: Secondary | ICD-10-CM | POA: Diagnosis not present

## 2023-08-21 DIAGNOSIS — Z1212 Encounter for screening for malignant neoplasm of rectum: Secondary | ICD-10-CM | POA: Diagnosis not present

## 2023-08-21 DIAGNOSIS — Z1211 Encounter for screening for malignant neoplasm of colon: Secondary | ICD-10-CM | POA: Diagnosis not present

## 2023-08-22 DIAGNOSIS — G4733 Obstructive sleep apnea (adult) (pediatric): Secondary | ICD-10-CM | POA: Diagnosis not present

## 2023-08-25 LAB — COLOGUARD: COLOGUARD: NEGATIVE

## 2023-09-19 DIAGNOSIS — G4733 Obstructive sleep apnea (adult) (pediatric): Secondary | ICD-10-CM | POA: Diagnosis not present

## 2023-10-20 DIAGNOSIS — G4733 Obstructive sleep apnea (adult) (pediatric): Secondary | ICD-10-CM | POA: Diagnosis not present

## 2023-12-14 DIAGNOSIS — G4733 Obstructive sleep apnea (adult) (pediatric): Secondary | ICD-10-CM | POA: Diagnosis not present

## 2023-12-20 DIAGNOSIS — Z1231 Encounter for screening mammogram for malignant neoplasm of breast: Secondary | ICD-10-CM | POA: Diagnosis not present

## 2023-12-20 DIAGNOSIS — Z01419 Encounter for gynecological examination (general) (routine) without abnormal findings: Secondary | ICD-10-CM | POA: Diagnosis not present

## 2023-12-20 DIAGNOSIS — Z6824 Body mass index (BMI) 24.0-24.9, adult: Secondary | ICD-10-CM | POA: Diagnosis not present

## 2023-12-20 DIAGNOSIS — R319 Hematuria, unspecified: Secondary | ICD-10-CM | POA: Diagnosis not present

## 2024-01-13 DIAGNOSIS — G4733 Obstructive sleep apnea (adult) (pediatric): Secondary | ICD-10-CM | POA: Diagnosis not present

## 2024-01-29 DIAGNOSIS — M65311 Trigger thumb, right thumb: Secondary | ICD-10-CM | POA: Diagnosis not present

## 2024-02-13 DIAGNOSIS — G4733 Obstructive sleep apnea (adult) (pediatric): Secondary | ICD-10-CM | POA: Diagnosis not present

## 2024-03-05 ENCOUNTER — Encounter: Payer: Self-pay | Admitting: Neurology

## 2024-03-05 NOTE — Telephone Encounter (Addendum)
 Spoke to patient made a sooner appointment 03/06/2024 with Dr Buck for annual f/u and discuss travel cpap

## 2024-03-06 ENCOUNTER — Ambulatory Visit: Admitting: Neurology

## 2024-03-06 ENCOUNTER — Encounter: Payer: Self-pay | Admitting: Neurology

## 2024-03-06 VITALS — BP 124/80 | HR 60 | Ht 62.0 in | Wt 131.2 lb

## 2024-03-06 DIAGNOSIS — Z8669 Personal history of other diseases of the nervous system and sense organs: Secondary | ICD-10-CM

## 2024-03-06 DIAGNOSIS — G4733 Obstructive sleep apnea (adult) (pediatric): Secondary | ICD-10-CM

## 2024-03-06 NOTE — Progress Notes (Signed)
 Subjective:    Patient ID: Lisa West is a 53 y.o. female.  HPI    Interim history:   Lisa West is a 53 year old with a medical history of migraine headaches and sleep apnea, who presents for follow-up consultation and her yearly checkup.  The patient is unaccompanied today.  I last saw her in October 2024 in a video visit, at which time she was compliant with her AutoPap.    Today, 03/06/2024: I reviewed her AutoPap compliance data from the past 30 days, she used her machine 29 out of 30 days with percent use days greater than 4 hours at 93%, indicating excellent compliance with an average usage for days on treatment of 6.6 hours.  Residual AHI at goal at 3.2/h, AutoPap pressure of 5 to 11 cm with EPR of 3, 95th percentile of pressure at 6.3 cm, leak on the low side.  She reports doing well with her current CPAP.  She would like to purchase a travel CPAP, [place].  She would like to try a different mask instead of the traditional fullface mask, she would like to try a hybrid style.  She is due for new supplies.  Thankfully, she has not had any significant migraines for months.  She does keep Ubrelvy  with her just in case.   The patient's allergies, current medications, family history, past medical history, past social history, past surgical history and problem list were reviewed and updated as appropriate.    Previously:   04/17/2023: She reports doing well, she is compliant with her AutoPap and tolerates it.  She has had no serious or frequent migraines, had to use Ubrelvy  once as she recalls and it is effective.  Her prescription will expire.  She is requesting a renewal.  She changed jobs a few months ago.  She works at [employer], Garment/textile technologist, 3 days a week.  She has a 1 hour commute but does not hit peak traffic either way and does not mind the drive.  She is without new concerns.  I reviewed her AutoPap compliance data for the past 30 days from 03/15/2023  through 04/13/2023, during which time she used her machine 100% with percent use days greater than 4 hours at 93%, indicating excellent compliance, average usage of 6.2 hours, residual AHI at goal at 2.5/h, 95th percentile pressure at 5.9 cm with a range of 5 to 11 cm with EPR of 3.  She has a Chief Executive Officer.  It does not have a modem.  She would like to see about getting a new machine with a modem.      I saw her on 04/18/2022, at which time she was mildly suboptimal with her AutoPap compliance.  She reported that she had done well with epidural neck injections with regards to neck pain and headaches.  She did not have to use Ubrelvy  frequently.   I reviewed her AutoPap compliance data from 03/18/2022 through 04/17/2022, which is a total of 31 days, during which time she used her machine 22 days with percent use days greater than 4 hours at 55%, indicating mildly suboptimal compliance with an average usage of 6 hours for days on treatment.  Residual AHI at goal at 1.4/h, 95th percentile of pressure at 5.7 cm with a range of 5 to 11 cm, leak on the low side, no central apneas.  She has a Web designer.  She will orts that she has not had any recent migraines, none  since February of this year.  She has done well with her neck pain after her epidural injection.  She is doing well overall, AutoPap is a nuisance at times, sometimes, half a dozen times even it turned off in the middle of the night when she was still using it.  She has not gotten in touch with her DME company yet.  She is up-to-date with her supplies, changes her filter every month.  Other than that, she is doing well.  She admits that there have been days where she skipped it altogether.  She is motivated to continue with treatment.  She needs a renewal on her Ubrelvy .  She has not had to use it lately thankfully.   I saw her on 03/11/2021, at which time she reported tolerating AutoPap therapy.  She had a couple of migraine attacks.  She  did not end up taking Ubrelvy  which I had prescribed.  She was advised to use the co-pay card and fill the prescription for Ubrelvy  to use it as needed.  She was compliant with her AutoPap.  She had received an epidural steroid injection in her spine through [facility] imaging in July 2022 with great improvement in her neck pain for which she saw Dr. Arthea Sharps.       She had a cervical spine MRI without contrast on 12/12/2020 and I reviewed the results: IMPRESSION: 1. Multilevel spondylosis of the cervical spine as described. 2. Moderate left and mild right foraminal stenosis at C3-4. 3. Moderate left foraminal narrowing at C4-5. 4. Severe left and moderate right foraminal narrowing at C5-6. 5. Moderate to severe right foraminal stenosis at C6-7.   Addendum, 03/11/2021, 5:12 PM: I received her AutoPap compliance data.  Set up date was 01/20/2021, I was able to review her compliance from 01/20/2021 through 03/10/2021, which is a total of 50 days, during which time she used her machine every night except for 1 night, percent use days greater than 4 hours at 86%, indicating very good compliance, average usage for days on treatment of 5.9 hours, residual AHI at goal at 3.6/h, pressure for the 95th percentile at 6.9 cm, maximum pressure 9.6 cm, range of 5 to 11 cm, leak on the low side with a 95th percentile at 2.9 L/min.     I first met her at the request of her primary care physician on 09/07/2020, at which time she reported an approximately 2-year history of recurrent headaches, no prior migraines, history of sinus congestion and eustachian tube dysfunction.  She was on Ubrelvy  as needed, we mutually agreed not to start a headache preventative medication and pursue further work-up.  She had some symptoms concerning for sleep apnea.  She had nocturnal headaches.  She was advised to proceed with a brain MRI as well as a sleep study.  She had a brain MRI with and without contrast on 09/15/2020 and I reviewed  the results:     IMPRESSION:    Normal MRI brain (with and without).   She was advised of the test results by phone call.  She had a home sleep test on 09/30/2020 which indicated moderate obstructive sleep apnea by number of events with a total AHI of 17.6/h, O2 nadir 91%.  She was encouraged to start a trial of AutoPap therapy to see if she had improvement in her symptoms.  She has a Resvent machine, start date was 01/20/2021 or thereabouts per patient.     09/07/20: (She) reports recurrent headaches for the past  2 years, first time she had a headache was in January 2020.  She had a preceding illness as far she recalls which was in September 2019, this was a severe chest cold and she was on Mucinex for several weeks.  Headaches were more frequent in the past, occurring about once every 2 to 3 weeks.  She describes the headache as a throbbing and stabbing sensation which wakes her up in the middle of the night, typically around 2 AM.  Headache lasts for about 12 hours.  It starts in the center of her forehead typically.  She thought it was sinus related and sought consultation with ENT.  She does have nasal congestion and postnasal drip as well as muffled hearing.  She tried Zyrtec but it exacerbated the headaches.  She is now on nasal spray, Flonase.   She does not have a prior history of migraines.  She does not have aura.  She has had her eyes examined, she goes every year.  She has prescription eyeglasses/contact lenses. She denies any sudden onset of one-sided weakness or numbness or tingling or droopy face or slurring of speech.  She does not typically have any nausea or vomiting except for once when she had nausea.   I reviewed your office note from 07/02/2020.  She was started on Ubrelvy  as needed.  She had an interim consultation with Dr. Alm Bouche in ENT on 08/11/2020 and was found to have deviated septum, inferior turbinate hypertrophy and eustachian tube dysfunction but no acute sinusitis.   She he had had a maxillofacial CT without contrast on 07/16/2019 and I reviewed the results: IMPRESSION: Normally aerated paranasal sinuses.  Patent sinus drainage pathways.   She had blood work through your office on 06/23/2020 and I reviewed the results: Lipid panel was benign, total cholesterol 131, triglycerides 32, HDL 70, LDL 52.  CMP showed BUN of 12, creatinine of 0.63, and benign results otherwise.  CBC with differential was benign, WBC 6.4, RBC 4.28, hemoglobin 12.8, hematocrit 39.2.   She drinks caffeine in limitation, 1 serving per day, she drinks alcohol very infrequently, has not had any in months.  She is a non-smoker.  She is married and lives with her husband.  She has 3 grown children, 2 daughters, 1 son, 1 daughter with migraines.  Patient works as a Garment/textile technologist for [employer].   She has not found any telltale triggers.  She has not had any increased amount of stress, she hydrates well.  She does not always achieve a whole lot of sleep, averages maybe 6 hours.  She goes to bed between 10 and 11 and rise time is around 5:30 AM.   Her Past Medical History Is Significant For: Past Medical History:  Diagnosis Date   Complication of anesthesia    hard to wake up   History of kidney stones    Kidney stone 03/2009   Migraine     Her Past Surgical History Is Significant For: Past Surgical History:  Procedure Laterality Date   ABDOMINAL HYSTERECTOMY Bilateral 05/07/2019   Procedure: HYSTERECTOMY ABDOMINAL with salpingectomy;  Surgeon: Johnnye Ade, MD;  Location: [facility];  Service: Gynecology;  Laterality: Bilateral;  need bed   CESAREAN SECTION  2002   CYSTOSCOPY  2010   w/stent   DILATION AND CURETTAGE OF UTERUS     DILATION AND EVACUATION     X 2   TONSILLECTOMY  1982    Her Family History Is Significant For: Family History  Problem Relation Age of Onset   Kidney disease Maternal Grandmother        kidney stones that cause her to lose one of  her kindeys    Breast cancer Paternal Grandmother 46   Migraines Daughter    Colon cancer Neg Hx    Esophageal cancer Neg Hx    Rectal cancer Neg Hx    Stomach cancer Neg Hx    Sleep apnea Neg Hx     Her Social History Is Significant For: Social History   Socioeconomic History   Marital status: Married    Spouse name: Not on file   Number of children: 3   Years of education: Not on file   Highest education level: Not on file  Occupational History   Occupation: CRNA  Tobacco Use   Smoking status: Never   Smokeless tobacco: Never  Vaping Use   Vaping status: Never Used  Substance and Sexual Activity   Alcohol use: Yes    Alcohol/week: 3.0 standard drinks of alcohol    Types: 3 Glasses of wine per week    Comment: occ   Drug use: Never   Sexual activity: Not on file  Other Topics Concern   Not on file  Social History Narrative   Lives with husband   Pt works    Social Drivers of Corporate investment banker Strain: Not on file  Food Insecurity: Not on file  Transportation Needs: Not on file  Physical Activity: Not on file  Stress: Not on file  Social Connections: Not on file    Her Allergies Are:  No Known Allergies:   Her Current Medications Are:  Outpatient Encounter Medications as of 03/06/2024  Medication Sig   Ubrogepant  (UBRELVY ) 100 MG TABS Take 1 tablet (100 mg total) by mouth as needed. May repeat in 2 hours if needed, no more than 2 pills in 24 hours.   COVID-19 At Home Antigen Test [facility] COVID-19 HOME TEST) KIT use as directed   COVID-19 mRNA bivalent vaccine, Pfizer, injection Inject into the muscle.   gabapentin  (NEURONTIN ) 100 MG capsule Take 2 capsules (200 mg total) by mouth at bedtime.   No facility-administered encounter medications on file as of 03/06/2024.  :  Review of Systems:  Out of a complete 14 point review of systems, all are reviewed and negative with the exception of these symptoms as listed below:  Review of Systems   Neurological:        Pt here for cpap f/u Pt states needing a new mask and discuss a travel cpap machine      Ess:4    Objective:  Neurological Exam  Physical Exam Physical Examination:   Vitals:   03/06/24 1552  BP: 124/80  Pulse: 60    General Examination: The patient is a very pleasant 53 y.o. female in no acute distress. She appears well-developed and well-nourished and well groomed.   HEENT: Normocephalic, atraumatic, pupils are equal, round and reactive to light, tracking well-preserved, hearing is grossly intact, face is symmetric, normal facial animation noted, speech without dysarthria, hypophonia or voice tremor, airway examination reveals just a little bit of mouth dryness, otherwise stable findings.  Tongue protrudes centrally and palate elevates symmetrically.    Chest: Clear to auscultation without wheezing, rhonchi or crackles noted.   Heart: S1+S2+0, regular and normal without murmurs, rubs or gallops noted.    Abdomen: Soft, non-tender and non-distended.   Extremities: There is no obvious swelling in the distal lower  extremities bilaterally.   Skin: Warm and dry without trophic changes noted.    Musculoskeletal: exam reveals no obvious joint deformities.    Neurologically:  Mental status: The patient is awake, alert and oriented in all 4 spheres. Her immediate and remote memory, attention, language skills and fund of knowledge are appropriate. There is no evidence of aphasia, agnosia, apraxia or anomia. Speech is clear with normal prosody and enunciation. Thought process is linear. Mood is normal and affect is normal.  Cranial nerves II - XII are as described above under HEENT exam.  Motor exam: Normal bulk, moving all 4 extremities without restriction, no obvious tremor.  Fine motor skills and coordination grossly intact.   Cerebellar testing: No dysmetria or intention. There is no truncal or gait ataxia.  Sensory exam: intact to light touch.  Gait,  station and balance: She stands easily. No veering to one side is noted. No leaning to one side is noted. Posture is age-appropriate and stance is narrow based. Gait shows normal stride length and normal pace. No problems turning are noted.    Assessment and plan:    In summary, Lisa West is a very pleasant 53 year old female with a history of migraine headaches, cervical radiculopathy, and sleep apnea, who presents for follow-up consultation of her migraine headaches and obstructive sleep apnea well-established on.  She has a history of migraines but thankfully has had improvement after her steroid injections into her neck and has really not suffered from recurrent migraines.  She has some leftover Ubrelvy  samples for as needed use.  She is doing well with AutoPap therapy and is compliant with treatment with good apnea control.  She would like to try different mask.  I placed an order for supplies and mask change.  She also would like to purchase a travel CPAP.  I placed an order for a travel/mini CPAP with a set pressure of 7 cm with EPR of 2.  She was given a written copy of the prescription in case she wants to look online for a travel CPAP.  She can talk to her DME provider about how to go about getting a travel CPAP.  She is advised to follow-up routinely in this clinic to see one of our nurse practitioners in 1 year.  I answered all her questions today and she was in agreement.  I spent 30 minutes in total face-to-face time and in reviewing records during pre-charting, more than 50% of which was spent in counseling and coordination of care, reviewing test results, reviewing medications and treatment regimen and/or in discussing or reviewing the diagnosis of OSA, history of migraines, prognosis and treatment options. Pertinent laboratory and imaging test results that were available during this visit with the patient were reviewed by me and considered in my medical decision making (see chart for  details).

## 2024-03-07 ENCOUNTER — Telehealth: Payer: Self-pay | Admitting: *Deleted

## 2024-03-07 NOTE — Telephone Encounter (Signed)
Cpap supply order sent to [facility].

## 2024-03-11 NOTE — Progress Notes (Deleted)
 New, Maryella Shivers, Otilio Jefferson, RN; Alain Honey; Jeris Penta, [place]; 1 other Received, thank you!

## 2024-03-11 NOTE — Telephone Encounter (Signed)
 New, Lisa West, Otilio Jefferson, RN; Alain Honey; Jeris Penta, [place]; 1 other Received, thank you!

## 2024-03-12 NOTE — Telephone Encounter (Signed)
 New, Adine Joylene Carlean Hilliard Heather LITTIE, RN; Jackson Macintosh; Tucker, Dolanda; Ziegler, Melissa; 1 other Hello,  Supply order received and sent to processing. as for the travel pap order : Please note, we no longer carry travel cpap units. We recommend patients use [email or web address] for all travel cpap needs.  Thank you,  Arvella Joylene

## 2024-04-18 DIAGNOSIS — M65341 Trigger finger, right ring finger: Secondary | ICD-10-CM | POA: Diagnosis not present

## 2024-04-18 DIAGNOSIS — M65311 Trigger thumb, right thumb: Secondary | ICD-10-CM | POA: Diagnosis not present

## 2024-05-24 ENCOUNTER — Other Ambulatory Visit (HOSPITAL_COMMUNITY): Payer: Self-pay

## 2024-05-24 ENCOUNTER — Telehealth: Payer: Self-pay

## 2024-05-24 NOTE — Telephone Encounter (Signed)
 Pharmacy Patient Advocate Encounter  Received notification from [facility] that Prior Authorization for Ubrelvy  has been APPROVED from 05/24/2024 to 05/24/2025. Ran test claim, Copay is $0. This test claim was processed through [facility]- copay amounts may vary at other pharmacies due to pharmacy/plan contracts, or as the patient moves through the different stages of their insurance plan.   PA #/Case ID/Reference #: [id number]  MAX QTY of 10 Tablets per 30DS allowed by plan.

## 2025-03-12 ENCOUNTER — Ambulatory Visit: Admitting: Adult Health
